# Patient Record
Sex: Female | Born: 1985 | Race: Black or African American | Hispanic: No | Marital: Single | Smoking: Former smoker
Health system: Southern US, Community
[De-identification: ages and names within clinical notes are randomized; demographics above are authoritative.]

## PROBLEM LIST (undated history)

## (undated) DIAGNOSIS — R7303 Prediabetes: Secondary | ICD-10-CM

## (undated) DIAGNOSIS — O24419 Gestational diabetes mellitus in pregnancy, unspecified control: Secondary | ICD-10-CM

## (undated) DIAGNOSIS — E119 Type 2 diabetes mellitus without complications: Secondary | ICD-10-CM

## (undated) DIAGNOSIS — G5603 Carpal tunnel syndrome, bilateral upper limbs: Secondary | ICD-10-CM

## (undated) DIAGNOSIS — J45909 Unspecified asthma, uncomplicated: Secondary | ICD-10-CM

## (undated) DIAGNOSIS — K5900 Constipation, unspecified: Secondary | ICD-10-CM

## (undated) HISTORY — PX: NO PAST SURGERIES: SHX2092

## (undated) HISTORY — DX: Type 2 diabetes mellitus without complications: E11.9

## (undated) HISTORY — DX: Gestational diabetes mellitus in pregnancy, unspecified control: O24.419

## (undated) HISTORY — DX: Unspecified asthma, uncomplicated: J45.909

---

## 2016-04-09 LAB — HEMOGLOBIN A1C: Hemoglobin A1C: 6.4

## 2016-04-09 LAB — OB RESULTS CONSOLE HGB/HCT, BLOOD
HEMATOCRIT: 40 %
HEMOGLOBIN: 13.3 g/dL

## 2016-04-09 LAB — OB RESULTS CONSOLE HEPATITIS B SURFACE ANTIGEN: Hepatitis B Surface Ag: NEGATIVE

## 2016-04-09 LAB — OB RESULTS CONSOLE ABO/RH: RH TYPE: POSITIVE

## 2016-04-09 LAB — OB RESULTS CONSOLE RPR: RPR: NONREACTIVE

## 2016-04-09 LAB — OB RESULTS CONSOLE PLATELET COUNT: Platelets: 273 10*3/uL

## 2016-04-09 LAB — OB RESULTS CONSOLE RUBELLA ANTIBODY, IGM: Rubella: IMMUNE

## 2016-04-09 LAB — OB RESULTS CONSOLE HIV ANTIBODY (ROUTINE TESTING): HIV: NONREACTIVE

## 2016-04-09 LAB — OB RESULTS CONSOLE ANTIBODY SCREEN: ANTIBODY SCREEN: NEGATIVE

## 2016-04-25 LAB — OB RESULTS CONSOLE GC/CHLAMYDIA
CHLAMYDIA, DNA PROBE: NEGATIVE
GC PROBE AMP, GENITAL: NEGATIVE

## 2016-04-25 LAB — CYTOLOGY - PAP
HPV DNA High Risk: NEGATIVE
Pap: NEGATIVE

## 2016-05-18 LAB — CREATININE CLEARANCE, URINE, 24 HOUR
CREATININE 24 H UR: 2.2
Protein, 24H Urine: 144

## 2016-06-27 LAB — DRUG SCREEN, URINE
Drug Screen, Urine: NEGATIVE
URINE CULTURE, OB: NEGATIVE

## 2016-09-24 NOTE — L&D Delivery Note (Signed)
Patient complete and pushing. SVD of viable female infant over intact perineum.  Infant delivered to mom's abdomen. Delayed cord clamping x 1 minute. Cord clamped x 2, cut. Spontaneous cry heard. Weight and Apgars pending. Cord blood obtained. Placenta delivered spontaneously and intact. LUS cleared of clot Vagina inspected.1st degree lacerations noted. Repaired with Vicryl Rapide EBL: 350cc Anesthesia: epidural, local

## 2016-10-01 LAB — OB RESULTS CONSOLE ABO/RH: RH Type: POSITIVE

## 2016-10-01 LAB — OB RESULTS CONSOLE RPR: RPR: NONREACTIVE

## 2016-10-01 LAB — OB RESULTS CONSOLE HIV ANTIBODY (ROUTINE TESTING): HIV: NONREACTIVE

## 2016-10-01 LAB — OB RESULTS CONSOLE ANTIBODY SCREEN: ANTIBODY SCREEN: NEGATIVE

## 2016-10-12 LAB — GLUCOSE, FASTING: Glucose Tolerance, Fasting: 80

## 2016-10-13 LAB — GLUCOSE, FASTING: GLUCOSE, FASTING-GESTATIONAL: 97

## 2016-10-17 LAB — OB RESULTS CONSOLE PLATELET COUNT: Platelets: 245 10*3/uL

## 2016-10-17 LAB — OB RESULTS CONSOLE HGB/HCT, BLOOD
HCT: 35 %
Hemoglobin: 11.5 g/dL

## 2016-10-22 ENCOUNTER — Encounter: Payer: Self-pay | Admitting: *Deleted

## 2016-10-25 ENCOUNTER — Encounter: Payer: Self-pay | Admitting: Obstetrics & Gynecology

## 2016-10-25 ENCOUNTER — Ambulatory Visit (INDEPENDENT_AMBULATORY_CARE_PROVIDER_SITE_OTHER): Payer: Medicaid Other | Admitting: Obstetrics & Gynecology

## 2016-10-25 VITALS — BP 124/65 | HR 100 | Ht 67.0 in | Wt 265.8 lb

## 2016-10-25 DIAGNOSIS — O24113 Pre-existing diabetes mellitus, type 2, in pregnancy, third trimester: Secondary | ICD-10-CM | POA: Diagnosis not present

## 2016-10-25 DIAGNOSIS — G5600 Carpal tunnel syndrome, unspecified upper limb: Secondary | ICD-10-CM

## 2016-10-25 DIAGNOSIS — IMO0001 Reserved for inherently not codable concepts without codable children: Secondary | ICD-10-CM

## 2016-10-25 DIAGNOSIS — Z658 Other specified problems related to psychosocial circumstances: Secondary | ICD-10-CM

## 2016-10-25 DIAGNOSIS — O24119 Pre-existing diabetes mellitus, type 2, in pregnancy, unspecified trimester: Secondary | ICD-10-CM

## 2016-10-25 DIAGNOSIS — E119 Type 2 diabetes mellitus without complications: Secondary | ICD-10-CM

## 2016-10-25 DIAGNOSIS — O26899 Other specified pregnancy related conditions, unspecified trimester: Secondary | ICD-10-CM

## 2016-10-25 DIAGNOSIS — Z87440 Personal history of urinary (tract) infections: Secondary | ICD-10-CM | POA: Insufficient documentation

## 2016-10-25 DIAGNOSIS — O0990 Supervision of high risk pregnancy, unspecified, unspecified trimester: Secondary | ICD-10-CM

## 2016-10-25 DIAGNOSIS — O0993 Supervision of high risk pregnancy, unspecified, third trimester: Secondary | ICD-10-CM

## 2016-10-25 DIAGNOSIS — O26893 Other specified pregnancy related conditions, third trimester: Secondary | ICD-10-CM

## 2016-10-25 DIAGNOSIS — J45909 Unspecified asthma, uncomplicated: Secondary | ICD-10-CM

## 2016-10-25 DIAGNOSIS — E669 Obesity, unspecified: Secondary | ICD-10-CM | POA: Insufficient documentation

## 2016-10-25 NOTE — Progress Notes (Signed)
Here for first prenatal visit. [facility] [place]. Given new pregnancy information.   [facility] results, pap smear results, and genetic testing results.

## 2016-10-25 NOTE — Progress Notes (Signed)
  Subjective:transfer care from [place], last visit 2-3 weeks ago    Andrea Diaz is a G1P0000 8535w3d being seen today for her first obstetrical visit.  Her obstetrical history is significant for class B DM. Patient does intend to breast feed. Pregnancy history fully reviewed.  Patient reports no complaints.  Vitals:   10/25/16 1249 10/25/16 1250  BP: 124/65   Pulse: 100   Weight: 265 lb 12.8 oz (120.6 kg)   Height:  5\' 7"  (1.702 m)    HISTORY: OB History  Gravida Para Term Preterm AB Living  1 0 0 0 0 0  SAB TAB Ectopic Multiple Live Births               # Outcome Date GA Lbr Len/2nd Weight Sex Delivery Anes PTL Lv  1 Current              Past Medical History:  Diagnosis Date  . Asthma   . Diabetes mellitus without complication (HCC)    History reviewed. No pertinent surgical history. Family History  Problem Relation Age of Onset  . Hypertension Mother   . Head & neck cancer Father   . Breast cancer Maternal Aunt   . Breast cancer Paternal Aunt      Exam    Uterus:     Pelvic Exam:                               System: Breast:      Skin: normal coloration and turgor, no rashes    Neurologic: oriented, normal mood   Extremities: normal strength, tone, and muscle mass   HEENT PERRLA   Mouth/Teeth     Neck supple   Cardiovascular: regular rate and rhythm   Respiratory:  appears well, vitals normal, no respiratory distress, acyanotic, normal RR   Abdomen: obese, gravid   Urinary: urethral meatus normal      Assessment:    Pregnancy: G1P0000 Patient Active Problem List   Diagnosis Date Noted  . Diabetes type 2, controlled (HCC) 10/25/2016  . Supervision of high-risk pregnancy 10/25/2016  . Asthma 10/25/2016  . Obesity 10/25/2016  . Psychosocial stressors 10/25/2016  . Carpal tunnel syndrome during pregnancy 10/25/2016  . Hx: UTI (urinary tract infection) 10/25/2016        Plan:     Initial labs drawn. Prenatal vitamins. Problem list  reviewed and updated. Genetic Screening discussed First Screen: reported as normal.  Ultrasound discussed; fetal survey: results reviewed.  Follow up in 1 weeks. 50% of 30 min visit spent on counseling and coordination of care.  FBS and PP reported as nl, bring record to next visit   Scheryl DarterJames Arnold 10/25/2016

## 2016-10-25 NOTE — Patient Instructions (Signed)
Third Trimester of Pregnancy The third trimester is from week 29 through week 40 (months 7 through 9). The third trimester is a time when the unborn baby (fetus) is growing rapidly. At the end of the ninth month, the fetus is about 20 inches in length and weighs 6-10 pounds. Body changes during your third trimester Your body goes through many changes during pregnancy. The changes vary from woman to woman. During the third trimester:  Your weight will continue to increase. You can expect to gain 25-35 pounds (11-16 kg) by the end of the pregnancy.  You may begin to get stretch marks on your hips, abdomen, and breasts.  You may urinate more often because the fetus is moving lower into your pelvis and pressing on your bladder.  You may develop or continue to have heartburn. This is caused by increased hormones that slow down muscles in the digestive tract.  You may develop or continue to have constipation because increased hormones slow digestion and cause the muscles that push waste through your intestines to relax.  You may develop hemorrhoids. These are swollen veins (varicose veins) in the rectum that can itch or be painful.  You may develop swollen, bulging veins (varicose veins) in your legs.  You may have increased body aches in the pelvis, back, or thighs. This is due to weight gain and increased hormones that are relaxing your joints.  You may have changes in your hair. These can include thickening of your hair, rapid growth, and changes in texture. Some women also have hair loss during or after pregnancy, or hair that feels dry or thin. Your hair will most likely return to normal after your baby is born.  Your breasts will continue to grow and they will continue to become tender. A yellow fluid (colostrum) may leak from your breasts. This is the first milk you are producing for your baby.  Your belly button may stick out.  You may notice more swelling in your hands, face, or  ankles.  You may have increased tingling or numbness in your hands, arms, and legs. The skin on your belly may also feel numb.  You may feel short of breath because of your expanding uterus.  You may have more problems sleeping. This can be caused by the size of your belly, increased need to urinate, and an increase in your body's metabolism.  You may notice the fetus "dropping," or moving lower in your abdomen.  You may have increased vaginal discharge.  Your cervix becomes thin and soft (effaced) near your due date. What to expect at prenatal visits You will have prenatal exams every 2 weeks until week 36. Then you will have weekly prenatal exams. During a routine prenatal visit:  You will be weighed to make sure you and the fetus are growing normally.  Your blood pressure will be taken.  Your abdomen will be measured to track your baby's growth.  The fetal heartbeat will be listened to.  Any test results from the previous visit will be discussed.  You may have a cervical check near your due date to see if you have effaced. At around 36 weeks, your health care provider will check your cervix. At the same time, your health care provider will also perform a test on the secretions of the vaginal tissue. This test is to determine if a type of bacteria, Group B streptococcus, is present. Your health care provider will explain this further. Your health care provider may ask you:    What your birth plan is.  How you are feeling.  If you are feeling the baby move.  If you have had any abnormal symptoms, such as leaking fluid, bleeding, severe headaches, or abdominal cramping.  If you are using any tobacco products, including cigarettes, chewing tobacco, and electronic cigarettes.  If you have any questions. Other tests or screenings that may be performed during your third trimester include:  Blood tests that check for low iron levels (anemia).  Fetal testing to check the health,  activity level, and growth of the fetus. Testing is done if you have certain medical conditions or if there are problems during the pregnancy.  Nonstress test (NST). This test checks the health of your baby to make sure there are no signs of problems, such as the baby not getting enough oxygen. During this test, a belt is placed around your belly. The baby is made to move, and its heart rate is monitored during movement. What is false labor? False labor is a condition in which you feel small, irregular tightenings of the muscles in the womb (contractions) that eventually go away. These are called Braxton Hicks contractions. Contractions may last for hours, days, or even weeks before true labor sets in. If contractions come at regular intervals, become more frequent, increase in intensity, or become painful, you should see your health care provider. What are the signs of labor?  Abdominal cramps.  Regular contractions that start at 10 minutes apart and become stronger and more frequent with time.  Contractions that start on the top of the uterus and spread down to the lower abdomen and back.  Increased pelvic pressure and dull back pain.  A watery or bloody mucus discharge that comes from the vagina.  Leaking of amniotic fluid. This is also known as your "water breaking." It could be a slow trickle or a gush. Let your doctor know if it has a color or strange odor. If you have any of these signs, call your health care provider right away, even if it is before your due date. Follow these instructions at home: Eating and drinking  Continue to eat regular, healthy meals.  Do not eat:  Raw meat or meat spreads.  Unpasteurized milk or cheese.  Unpasteurized juice.  Store-made salad.  Refrigerated smoked seafood.  Hot dogs or deli meat, unless they are piping hot.  More than 6 ounces of albacore tuna a week.  Shark, swordfish, king mackerel, or tile fish.  Store-made salads.  Raw  sprouts, such as mung bean or alfalfa sprouts.  Take prenatal vitamins as told by your health care provider.  Take 1000 mg of calcium daily as told by your health care provider.  If you develop constipation:  Take over-the-counter or prescription medicines.  Drink enough fluid to keep your urine clear or pale yellow.  Eat foods that are high in fiber, such as fresh fruits and vegetables, whole grains, and beans.  Limit foods that are high in fat and processed sugars, such as fried and sweet foods. Activity  Exercise only as directed by your health care provider. Healthy pregnant women should aim for 2 hours and 30 minutes of moderate exercise per week. If you experience any pain or discomfort while exercising, stop.  Avoid heavy lifting.  Do not exercise in extreme heat or humidity, or at high altitudes.  Wear low-heel, comfortable shoes.  Practice good posture.  Do not travel far distances unless it is absolutely necessary and only with the approval   of your health care provider.  Wear your seat belt at all times while in a car, on a bus, or on a plane.  Take frequent breaks and rest with your legs elevated if you have leg cramps or low back pain.  Do not use hot tubs, steam rooms, or saunas.  You may continue to have sex unless your health care provider tells you otherwise. Lifestyle  Do not use any products that contain nicotine or tobacco, such as cigarettes and e-cigarettes. If you need help quitting, ask your health care provider.  Do not drink alcohol.  Do not use any medicinal herbs or unprescribed drugs. These chemicals affect the formation and growth of the baby.  If you develop varicose veins:  Wear support pantyhose or compression stockings as told by your healthcare provider.  Elevate your feet for 15 minutes, 3-4 times a day.  Wear a supportive maternity bra to help with breast tenderness. General instructions  Take over-the-counter and prescription  medicines only as told by your health care provider. There are medicines that are either safe or unsafe to take during pregnancy.  Take warm sitz baths to soothe any pain or discomfort caused by hemorrhoids. Use hemorrhoid cream or witch hazel if your health care provider approves.  Avoid cat litter boxes and soil used by cats. These carry germs that can cause birth defects in the baby. If you have a cat, ask someone to clean the litter box for you.  To prepare for the arrival of your baby:  Take prenatal classes to understand, practice, and ask questions about the labor and delivery.  Make a trial run to the hospital.  Visit the hospital and tour the maternity area.  Arrange for maternity or paternity leave through employers.  Arrange for family and friends to take care of pets while you are in the hospital.  Purchase a rear-facing car seat and make sure you know how to install it in your car.  Pack your hospital bag.  Prepare the baby's nursery. Make sure to remove all pillows and stuffed animals from the baby's crib to prevent suffocation.  Visit your dentist if you have not gone during your pregnancy. Use a soft toothbrush to brush your teeth and be gentle when you floss.  Keep all prenatal follow-up visits as told by your health care provider. This is important. Contact a health care provider if:  You are unsure if you are in labor or if your water has broken.  You become dizzy.  You have mild pelvic cramps, pelvic pressure, or nagging pain in your abdominal area.  You have lower back pain.  You have persistent nausea, vomiting, or diarrhea.  You have an unusual or bad smelling vaginal discharge.  You have pain when you urinate. Get help right away if:  You have a fever.  You are leaking fluid from your vagina.  You have spotting or bleeding from your vagina.  You have severe abdominal pain or cramping.  You have rapid weight loss or weight gain.  You have  shortness of breath with chest pain.  You notice sudden or extreme swelling of your face, hands, ankles, feet, or legs.  Your baby makes fewer than 10 movements in 2 hours.  You have severe headaches that do not go away with medicine.  You have vision changes. Summary  The third trimester is from week 29 through week 40, months 7 through 9. The third trimester is a time when the unborn baby (fetus)   is growing rapidly.  During the third trimester, your discomfort may increase as you and your baby continue to gain weight. You may have abdominal, leg, and back pain, sleeping problems, and an increased need to urinate.  During the third trimester your breasts will keep growing and they will continue to become tender. A yellow fluid (colostrum) may leak from your breasts. This is the first milk you are producing for your baby.  False labor is a condition in which you feel small, irregular tightenings of the muscles in the womb (contractions) that eventually go away. These are called Braxton Hicks contractions. Contractions may last for hours, days, or even weeks before true labor sets in.  Signs of labor can include: abdominal cramps; regular contractions that start at 10 minutes apart and become stronger and more frequent with time; watery or bloody mucus discharge that comes from the vagina; increased pelvic pressure and dull back pain; and leaking of amniotic fluid. This information is not intended to replace advice given to you by your health care provider. Make sure you discuss any questions you have with your health care provider. Document Released: 09/04/2001 Document Revised: 02/16/2016 Document Reviewed: 11/11/2012 Elsevier Interactive Patient Education  2017 [employer]  

## 2016-10-26 LAB — POCT URINALYSIS DIP (DEVICE)
BILIRUBIN URINE: NEGATIVE
Glucose, UA: 100 mg/dL — AB
Ketones, ur: NEGATIVE mg/dL
LEUKOCYTES UA: NEGATIVE
NITRITE: NEGATIVE
PH: 6 (ref 5.0–8.0)
Protein, ur: 30 mg/dL — AB
Specific Gravity, Urine: >=1.03 (ref 1.005–1.030)
UROBILINOGEN UA: 0.2 mg/dL (ref 0.0–1.0)

## 2016-10-29 ENCOUNTER — Ambulatory Visit (INDEPENDENT_AMBULATORY_CARE_PROVIDER_SITE_OTHER): Payer: Medicaid Other | Admitting: Family Medicine

## 2016-10-29 VITALS — BP 122/69 | HR 110

## 2016-10-29 DIAGNOSIS — O24113 Pre-existing diabetes mellitus, type 2, in pregnancy, third trimester: Secondary | ICD-10-CM | POA: Diagnosis not present

## 2016-10-29 DIAGNOSIS — O24119 Pre-existing diabetes mellitus, type 2, in pregnancy, unspecified trimester: Secondary | ICD-10-CM

## 2016-10-29 NOTE — Progress Notes (Signed)
NST reviewed - reactive 

## 2016-11-01 ENCOUNTER — Ambulatory Visit (HOSPITAL_COMMUNITY)
Admission: RE | Admit: 2016-11-01 | Discharge: 2016-11-01 | Disposition: A | Discharge status: Home / Self Care | Payer: Medicaid Other | Source: Ambulatory Visit | Attending: Obstetrics & Gynecology | Admitting: Obstetrics & Gynecology

## 2016-11-01 ENCOUNTER — Other Ambulatory Visit: Payer: Self-pay | Admitting: Obstetrics & Gynecology

## 2016-11-01 ENCOUNTER — Encounter (HOSPITAL_COMMUNITY): Payer: Self-pay

## 2016-11-01 ENCOUNTER — Ambulatory Visit (INDEPENDENT_AMBULATORY_CARE_PROVIDER_SITE_OTHER): Payer: Medicaid Other | Admitting: Obstetrics and Gynecology

## 2016-11-01 DIAGNOSIS — O24113 Pre-existing diabetes mellitus, type 2, in pregnancy, third trimester: Secondary | ICD-10-CM | POA: Diagnosis not present

## 2016-11-01 DIAGNOSIS — O0993 Supervision of high risk pregnancy, unspecified, third trimester: Secondary | ICD-10-CM

## 2016-11-01 DIAGNOSIS — O99213 Obesity complicating pregnancy, third trimester: Secondary | ICD-10-CM

## 2016-11-01 DIAGNOSIS — IMO0001 Reserved for inherently not codable concepts without codable children: Secondary | ICD-10-CM

## 2016-11-01 DIAGNOSIS — Z3689 Encounter for other specified antenatal screening: Secondary | ICD-10-CM | POA: Diagnosis not present

## 2016-11-01 DIAGNOSIS — J45909 Unspecified asthma, uncomplicated: Secondary | ICD-10-CM

## 2016-11-01 DIAGNOSIS — O24119 Pre-existing diabetes mellitus, type 2, in pregnancy, unspecified trimester: Secondary | ICD-10-CM

## 2016-11-01 DIAGNOSIS — Z658 Other specified problems related to psychosocial circumstances: Secondary | ICD-10-CM | POA: Diagnosis not present

## 2016-11-01 DIAGNOSIS — Z3A35 35 weeks gestation of pregnancy: Secondary | ICD-10-CM

## 2016-11-01 DIAGNOSIS — O26899 Other specified pregnancy related conditions, unspecified trimester: Secondary | ICD-10-CM

## 2016-11-01 DIAGNOSIS — E119 Type 2 diabetes mellitus without complications: Secondary | ICD-10-CM

## 2016-11-01 DIAGNOSIS — G5600 Carpal tunnel syndrome, unspecified upper limb: Secondary | ICD-10-CM

## 2016-11-01 DIAGNOSIS — Z87440 Personal history of urinary (tract) infections: Secondary | ICD-10-CM

## 2016-11-01 DIAGNOSIS — O26893 Other specified pregnancy related conditions, third trimester: Secondary | ICD-10-CM | POA: Diagnosis not present

## 2016-11-01 NOTE — Progress Notes (Signed)
US scheduled for today.

## 2016-11-01 NOTE — Progress Notes (Signed)
   PRENATAL VISIT NOTE  Subjective:  Andrea Diaz is a [age] [address].o. G1P0000 at 8387w3d being seen today for ongoing prenatal care.  She is currently monitored for the following issues for this high-risk pregnancy and has Diabetes type 2, controlled (HCC); Supervision of high-risk pregnancy; Asthma; Obesity; Psychosocial stressors; Carpal tunnel syndrome during pregnancy; and Hx: UTI (urinary tract infection) on her problem list.  Patient reports no complaints.   .  .   . Denies leaking of fluid.   The following portions of the patient's history were reviewed and updated as appropriate: allergies, current medications, past family history, past medical history, past social history, past surgical history and problem list. Problem list updated.  Objective:  There were no vitals filed for this visit.  Fetal Status:           General:  Alert, oriented and cooperative. Patient is in no acute distress.  Skin: Skin is warm and dry. No rash noted.   Cardiovascular: Normal heart rate noted  Respiratory: Normal respiratory effort, no problems with respiration noted  Abdomen: Soft, gravid, appropriate for gestational age.       Pelvic:  Cervical exam deferred        Extremities: Normal range of motion.     Mental Status: Normal mood and affect. Normal behavior. Normal judgment and thought content.   Assessment and Plan:  Pregnancy: G1P0000 at 6887w3d  1. Pre-existing type 2 diabetes mellitus during pregnancy, antepartum CBGs reviewed and majority within range. Fasting are in the low 100.  Discussed consuming a protein rich snack which the patient is not doing Encouraged the patient to increase her physical activity by walking 30 minutes daily Continue current Lantus and humolog regimen NST reviewed and reactive  2. Supervision of high risk pregnancy in third trimester Patient is doing well Follow up growth ultrasound today Cultures next visit  Preterm labor symptoms and general obstetric precautions  including but not limited to vaginal bleeding, contractions, leaking of fluid and fetal movement were reviewed in detail with the patient. Please refer to After Visit Summary for other counseling recommendations.  Return in about 7 days (around 11/08/2016) for Ob fu and NST/AFI.   Catalina AntiguaPeggy Carles Florea, MD

## 2016-11-02 LAB — POCT URINALYSIS DIP (DEVICE)
BILIRUBIN URINE: NEGATIVE
Glucose, UA: 500 mg/dL — AB
KETONES UR: NEGATIVE mg/dL
Nitrite: NEGATIVE
PH: 6.5 (ref 5.0–8.0)
Protein, ur: NEGATIVE mg/dL
Specific Gravity, Urine: 1.02 (ref 1.005–1.030)
Urobilinogen, UA: 0.2 mg/dL (ref 0.0–1.0)

## 2016-11-05 ENCOUNTER — Ambulatory Visit (INDEPENDENT_AMBULATORY_CARE_PROVIDER_SITE_OTHER): Payer: Medicaid Other | Admitting: *Deleted

## 2016-11-05 DIAGNOSIS — O24119 Pre-existing diabetes mellitus, type 2, in pregnancy, unspecified trimester: Secondary | ICD-10-CM

## 2016-11-05 DIAGNOSIS — O24113 Pre-existing diabetes mellitus, type 2, in pregnancy, third trimester: Secondary | ICD-10-CM | POA: Diagnosis present

## 2016-11-05 MED ORDER — FREESTYLE LITE TEST VI STRP: ORAL_STRIP | 12 refills | Status: AC

## 2016-11-05 NOTE — Progress Notes (Signed)
Pt requested Rx for test strips - sent to pharmacy as requested.

## 2016-11-06 ENCOUNTER — Telehealth: Payer: Self-pay | Admitting: *Deleted

## 2016-11-06 MED ORDER — ACCU-CHEK SOFTCLIX LANCETS MISC: 12 refills | Status: AC

## 2016-11-06 MED ORDER — GLUCOSE BLOOD VI STRP: ORAL_STRIP | 12 refills | Status: AC

## 2016-11-06 MED ORDER — BLOOD GLUCOSE METER KIT: PACK | 0 refills | Status: DC

## 2016-11-06 MED ORDER — BLOOD GLUCOSE METER KIT: PACK | 0 refills | Status: AC

## 2016-11-06 NOTE — Telephone Encounter (Signed)
Pt left message stating that the prescription for Freestyle Lite test strips was [facility]. She requests a new prescription. After speaking with the pharmacist @ pt's pharmacy, I placed new orders for glucose meter and testing supplies which will be covered by [place] Medicaid.  I called pt and left message stating this information and she may obtain supplies today from [facility]. She may call back if she has additional questions.

## 2016-11-09 ENCOUNTER — Ambulatory Visit (INDEPENDENT_AMBULATORY_CARE_PROVIDER_SITE_OTHER): Payer: Medicaid Other | Admitting: Obstetrics & Gynecology

## 2016-11-09 ENCOUNTER — Ambulatory Visit: Payer: Self-pay

## 2016-11-09 ENCOUNTER — Other Ambulatory Visit (HOSPITAL_COMMUNITY)
Admission: RE | Admit: 2016-11-09 | Discharge: 2016-11-09 | Disposition: A | Discharge status: Home / Self Care | Payer: Medicaid Other | Source: Ambulatory Visit | Attending: Obstetrics & Gynecology | Admitting: Obstetrics & Gynecology

## 2016-11-09 VITALS — BP 127/69 | HR 112

## 2016-11-09 DIAGNOSIS — G5600 Carpal tunnel syndrome, unspecified upper limb: Secondary | ICD-10-CM | POA: Diagnosis not present

## 2016-11-09 DIAGNOSIS — O24113 Pre-existing diabetes mellitus, type 2, in pregnancy, third trimester: Secondary | ICD-10-CM | POA: Diagnosis not present

## 2016-11-09 DIAGNOSIS — Z3689 Encounter for other specified antenatal screening: Secondary | ICD-10-CM

## 2016-11-09 DIAGNOSIS — O24119 Pre-existing diabetes mellitus, type 2, in pregnancy, unspecified trimester: Secondary | ICD-10-CM

## 2016-11-09 DIAGNOSIS — Z113 Encounter for screening for infections with a predominantly sexual mode of transmission: Secondary | ICD-10-CM | POA: Diagnosis present

## 2016-11-09 DIAGNOSIS — O26893 Other specified pregnancy related conditions, third trimester: Secondary | ICD-10-CM | POA: Diagnosis not present

## 2016-11-09 DIAGNOSIS — O0993 Supervision of high risk pregnancy, unspecified, third trimester: Secondary | ICD-10-CM

## 2016-11-09 DIAGNOSIS — Z658 Other specified problems related to psychosocial circumstances: Secondary | ICD-10-CM | POA: Diagnosis not present

## 2016-11-09 LAB — OB RESULTS CONSOLE GBS: STREP GROUP B AG: POSITIVE

## 2016-11-09 MED ORDER — POLYETHYLENE GLYCOL 3350 17 G PO PACK: 17.0000 g | PACK | Freq: Every day | ORAL | 0 refills | Status: AC

## 2016-11-09 NOTE — Patient Instructions (Signed)
Third Trimester of Pregnancy The third trimester is from week 29 through week 40 (months 7 through 9). The third trimester is a time when the unborn baby (fetus) is growing rapidly. At the end of the ninth month, the fetus is about 20 inches in length and weighs 6-10 pounds. Body changes during your third trimester Your body goes through many changes during pregnancy. The changes vary from woman to woman. During the third trimester:  Your weight will continue to increase. You can expect to gain 25-35 pounds (11-16 kg) by the end of the pregnancy.  You may begin to get stretch marks on your hips, abdomen, and breasts.  You may urinate more often because the fetus is moving lower into your pelvis and pressing on your bladder.  You may develop or continue to have heartburn. This is caused by increased hormones that slow down muscles in the digestive tract.  You may develop or continue to have constipation because increased hormones slow digestion and cause the muscles that push waste through your intestines to relax.  You may develop hemorrhoids. These are swollen veins (varicose veins) in the rectum that can itch or be painful.  You may develop swollen, bulging veins (varicose veins) in your legs.  You may have increased body aches in the pelvis, back, or thighs. This is due to weight gain and increased hormones that are relaxing your joints.  You may have changes in your hair. These can include thickening of your hair, rapid growth, and changes in texture. Some women also have hair loss during or after pregnancy, or hair that feels dry or thin. Your hair will most likely return to normal after your baby is born.  Your breasts will continue to grow and they will continue to become tender. A yellow fluid (colostrum) may leak from your breasts. This is the first milk you are producing for your baby.  Your belly button may stick out.  You may notice more swelling in your hands, face, or  ankles.  You may have increased tingling or numbness in your hands, arms, and legs. The skin on your belly may also feel numb.  You may feel short of breath because of your expanding uterus.  You may have more problems sleeping. This can be caused by the size of your belly, increased need to urinate, and an increase in your body's metabolism.  You may notice the fetus "dropping," or moving lower in your abdomen.  You may have increased vaginal discharge.  Your cervix becomes thin and soft (effaced) near your due date. What to expect at prenatal visits You will have prenatal exams every 2 weeks until week 36. Then you will have weekly prenatal exams. During a routine prenatal visit:  You will be weighed to make sure you and the fetus are growing normally.  Your blood pressure will be taken.  Your abdomen will be measured to track your baby's growth.  The fetal heartbeat will be listened to.  Any test results from the previous visit will be discussed.  You may have a cervical check near your due date to see if you have effaced. At around 36 weeks, your health care provider will check your cervix. At the same time, your health care provider will also perform a test on the secretions of the vaginal tissue. This test is to determine if a type of bacteria, Group B streptococcus, is present. Your health care provider will explain this further. Your health care provider may ask you:    What your birth plan is.  How you are feeling.  If you are feeling the baby move.  If you have had any abnormal symptoms, such as leaking fluid, bleeding, severe headaches, or abdominal cramping.  If you are using any tobacco products, including cigarettes, chewing tobacco, and electronic cigarettes.  If you have any questions. Other tests or screenings that may be performed during your third trimester include:  Blood tests that check for low iron levels (anemia).  Fetal testing to check the health,  activity level, and growth of the fetus. Testing is done if you have certain medical conditions or if there are problems during the pregnancy.  Nonstress test (NST). This test checks the health of your baby to make sure there are no signs of problems, such as the baby not getting enough oxygen. During this test, a belt is placed around your belly. The baby is made to move, and its heart rate is monitored during movement. What is false labor? False labor is a condition in which you feel small, irregular tightenings of the muscles in the womb (contractions) that eventually go away. These are called Braxton Hicks contractions. Contractions may last for hours, days, or even weeks before true labor sets in. If contractions come at regular intervals, become more frequent, increase in intensity, or become painful, you should see your health care provider. What are the signs of labor?  Abdominal cramps.  Regular contractions that start at 10 minutes apart and become stronger and more frequent with time.  Contractions that start on the top of the uterus and spread down to the lower abdomen and back.  Increased pelvic pressure and dull back pain.  A watery or bloody mucus discharge that comes from the vagina.  Leaking of amniotic fluid. This is also known as your "water breaking." It could be a slow trickle or a gush. Let your doctor know if it has a color or strange odor. If you have any of these signs, call your health care provider right away, even if it is before your due date. Follow these instructions at home: Eating and drinking  Continue to eat regular, healthy meals.  Do not eat:  Raw meat or meat spreads.  Unpasteurized milk or cheese.  Unpasteurized juice.  Store-made salad.  Refrigerated smoked seafood.  Hot dogs or deli meat, unless they are piping hot.  More than 6 ounces of albacore tuna a week.  Shark, swordfish, king mackerel, or tile fish.  Store-made salads.  Raw  sprouts, such as mung bean or alfalfa sprouts.  Take prenatal vitamins as told by your health care provider.  Take 1000 mg of calcium daily as told by your health care provider.  If you develop constipation:  Take over-the-counter or prescription medicines.  Drink enough fluid to keep your urine clear or pale yellow.  Eat foods that are high in fiber, such as fresh fruits and vegetables, whole grains, and beans.  Limit foods that are high in fat and processed sugars, such as fried and sweet foods. Activity  Exercise only as directed by your health care provider. Healthy pregnant women should aim for 2 hours and 30 minutes of moderate exercise per week. If you experience any pain or discomfort while exercising, stop.  Avoid heavy lifting.  Do not exercise in extreme heat or humidity, or at high altitudes.  Wear low-heel, comfortable shoes.  Practice good posture.  Do not travel far distances unless it is absolutely necessary and only with the approval   of your health care provider.  Wear your seat belt at all times while in a car, on a bus, or on a plane.  Take frequent breaks and rest with your legs elevated if you have leg cramps or low back pain.  Do not use hot tubs, steam rooms, or saunas.  You may continue to have sex unless your health care provider tells you otherwise. Lifestyle  Do not use any products that contain nicotine or tobacco, such as cigarettes and e-cigarettes. If you need help quitting, ask your health care provider.  Do not drink alcohol.  Do not use any medicinal herbs or unprescribed drugs. These chemicals affect the formation and growth of the baby.  If you develop varicose veins:  Wear support pantyhose or compression stockings as told by your healthcare provider.  Elevate your feet for 15 minutes, 3-4 times a day.  Wear a supportive maternity bra to help with breast tenderness. General instructions  Take over-the-counter and prescription  medicines only as told by your health care provider. There are medicines that are either safe or unsafe to take during pregnancy.  Take warm sitz baths to soothe any pain or discomfort caused by hemorrhoids. Use hemorrhoid cream or witch hazel if your health care provider approves.  Avoid cat litter boxes and soil used by cats. These carry germs that can cause birth defects in the baby. If you have a cat, ask someone to clean the litter box for you.  To prepare for the arrival of your baby:  Take prenatal classes to understand, practice, and ask questions about the labor and delivery.  Make a trial run to the hospital.  Visit the hospital and tour the maternity area.  Arrange for maternity or paternity leave through employers.  Arrange for family and friends to take care of pets while you are in the hospital.  Purchase a rear-facing car seat and make sure you know how to install it in your car.  Pack your hospital bag.  Prepare the baby's nursery. Make sure to remove all pillows and stuffed animals from the baby's crib to prevent suffocation.  Visit your dentist if you have not gone during your pregnancy. Use a soft toothbrush to brush your teeth and be gentle when you floss.  Keep all prenatal follow-up visits as told by your health care provider. This is important. Contact a health care provider if:  You are unsure if you are in labor or if your water has broken.  You become dizzy.  You have mild pelvic cramps, pelvic pressure, or nagging pain in your abdominal area.  You have lower back pain.  You have persistent nausea, vomiting, or diarrhea.  You have an unusual or bad smelling vaginal discharge.  You have pain when you urinate. Get help right away if:  You have a fever.  You are leaking fluid from your vagina.  You have spotting or bleeding from your vagina.  You have severe abdominal pain or cramping.  You have rapid weight loss or weight gain.  You have  shortness of breath with chest pain.  You notice sudden or extreme swelling of your face, hands, ankles, feet, or legs.  Your baby makes fewer than 10 movements in 2 hours.  You have severe headaches that do not go away with medicine.  You have vision changes. Summary  The third trimester is from week 29 through week 40, months 7 through 9. The third trimester is a time when the unborn baby (fetus)   is growing rapidly.  During the third trimester, your discomfort may increase as you and your baby continue to gain weight. You may have abdominal, leg, and back pain, sleeping problems, and an increased need to urinate.  During the third trimester your breasts will keep growing and they will continue to become tender. A yellow fluid (colostrum) may leak from your breasts. This is the first milk you are producing for your baby.  False labor is a condition in which you feel small, irregular tightenings of the muscles in the womb (contractions) that eventually go away. These are called Braxton Hicks contractions. Contractions may last for hours, days, or even weeks before true labor sets in.  Signs of labor can include: abdominal cramps; regular contractions that start at 10 minutes apart and become stronger and more frequent with time; watery or bloody mucus discharge that comes from the vagina; increased pelvic pressure and dull back pain; and leaking of amniotic fluid. This information is not intended to replace advice given to you by your health care provider. Make sure you discuss any questions you have with your health care provider. Document Released: 09/04/2001 Document Revised: 02/16/2016 Document Reviewed: 11/11/2012 Elsevier Interactive Patient Education  2017 [employer]  

## 2016-11-09 NOTE — Progress Notes (Signed)
US done 2/8.  Pt reports severe constipation.

## 2016-11-09 NOTE — Progress Notes (Signed)
   PRENATAL VISIT NOTE  Subjective:  Andrea Diaz is a 31 y.o. G1P0000 at 1724w4d being seen today for ongoing prenatal care.  She is currently monitored for the following issues for this high-risk pregnancy and has Diabetes type 2, controlled (HCC); Supervision of high-risk pregnancy; Asthma; Obesity; Psychosocial stressors; Carpal tunnel syndrome during pregnancy; and Hx: UTI (urinary tract infection) on her problem list.  Patient reports constipation.  Contractions: Irregular. Vag. Bleeding: None.  Movement: Present. Denies leaking of fluid.   The following portions of the patient's history were reviewed and updated as appropriate: allergies, current medications, past family history, past medical history, past social history, past surgical history and problem list. Problem list updated.  Objective:   Vitals:   11/09/16 0959  BP: 127/69  Pulse: (!) 112    Fetal Status: Fetal Heart Rate (bpm): NST   Movement: Present     General:  Alert, oriented and cooperative. Patient is in no acute distress.  Skin: Skin is warm and dry. No rash noted.   Cardiovascular: Normal heart rate noted  Respiratory: Normal respiratory effort, no problems with respiration noted  Abdomen: Soft, gravid, appropriate for gestational age. Pain/Pressure: Present     Pelvic:  Cervical exam deferred        Extremities: Normal range of motion.     Mental Status: Normal mood and affect. Normal behavior. Normal judgment and thought content.   Assessment and Plan:  Pregnancy: G1P0000 at 4724w4d  1. Pre-existing type 2 diabetes mellitus during pregnancy, antepartum States BG values are stable and in range, will not change insulin dose - Fetal nonstress test - US OB Limited  2. Supervision of high risk pregnancy in third trimester  - Culture, beta strep (group b only) - GC/Chlamydia probe amp ([place] - polyethylene glycol (MIRALAX) packet; Take 17 g by mouth daily.  Dispense: 14 each; Refill:  0  Preterm labor symptoms and general obstetric precautions including but not limited to vaginal bleeding, contractions, leaking of fluid and fetal movement were reviewed in detail with the patient. Please refer to After Visit Summary for other counseling recommendations.  Return in about 6 days (around 11/15/2016) for 2/22 or 2/23  for Ob fu and NST/AFI.   Adam PhenixJames G Arnold, MD

## 2016-11-10 LAB — CULTURE, BETA STREP (GROUP B ONLY)

## 2016-11-12 ENCOUNTER — Ambulatory Visit (INDEPENDENT_AMBULATORY_CARE_PROVIDER_SITE_OTHER): Payer: Medicaid Other | Admitting: Obstetrics & Gynecology

## 2016-11-12 VITALS — BP 132/82 | HR 103

## 2016-11-12 DIAGNOSIS — O24113 Pre-existing diabetes mellitus, type 2, in pregnancy, third trimester: Secondary | ICD-10-CM

## 2016-11-12 DIAGNOSIS — O24119 Pre-existing diabetes mellitus, type 2, in pregnancy, unspecified trimester: Secondary | ICD-10-CM

## 2016-11-12 LAB — GC/CHLAMYDIA PROBE AMP (~~LOC~~) NOT AT ARMC
Chlamydia: NEGATIVE
NEISSERIA GONORRHEA: NEGATIVE

## 2016-11-12 NOTE — Progress Notes (Signed)
NST Reactive on 11/12/16

## 2016-11-14 NOTE — Progress Notes (Signed)
Reactive NST on 11/05/16

## 2016-11-15 ENCOUNTER — Telehealth (HOSPITAL_COMMUNITY): Payer: Self-pay | Admitting: *Deleted

## 2016-11-15 ENCOUNTER — Encounter (HOSPITAL_COMMUNITY): Payer: Self-pay | Admitting: *Deleted

## 2016-11-15 ENCOUNTER — Ambulatory Visit: Payer: Self-pay

## 2016-11-15 ENCOUNTER — Ambulatory Visit (INDEPENDENT_AMBULATORY_CARE_PROVIDER_SITE_OTHER): Payer: Medicaid Other | Admitting: Advanced Practice Midwife

## 2016-11-15 VITALS — BP 135/86 | HR 105 | Wt 271.1 lb

## 2016-11-15 DIAGNOSIS — O24113 Pre-existing diabetes mellitus, type 2, in pregnancy, third trimester: Secondary | ICD-10-CM

## 2016-11-15 DIAGNOSIS — Z3689 Encounter for other specified antenatal screening: Secondary | ICD-10-CM | POA: Diagnosis not present

## 2016-11-15 DIAGNOSIS — O24119 Pre-existing diabetes mellitus, type 2, in pregnancy, unspecified trimester: Secondary | ICD-10-CM

## 2016-11-15 DIAGNOSIS — O0993 Supervision of high risk pregnancy, unspecified, third trimester: Secondary | ICD-10-CM

## 2016-11-15 LAB — POCT URINALYSIS DIP (DEVICE)
BILIRUBIN URINE: NEGATIVE
GLUCOSE, UA: 500 mg/dL — AB
Ketones, ur: NEGATIVE mg/dL
NITRITE: NEGATIVE
Protein, ur: NEGATIVE mg/dL
SPECIFIC GRAVITY, URINE: 1.025 (ref 1.005–1.030)
UROBILINOGEN UA: 1 mg/dL (ref 0.0–1.0)
pH: 7 (ref 5.0–8.0)

## 2016-11-15 NOTE — Telephone Encounter (Signed)
Preadmission screen  

## 2016-11-15 NOTE — Progress Notes (Signed)
   PRENATAL VISIT NOTE  Subjective:  Andrea Diaz is a 31 y.o. G1P0000 at 7520w3d being seen today for ongoing prenatal care.  She is currently monitored for the following issues for this high-risk pregnancy and has Diabetes type 2, controlled (HCC); Supervision of high-risk pregnancy; Asthma; Obesity; Psychosocial stressors; Carpal tunnel syndrome during pregnancy; and Hx: UTI (urinary tract infection) on her problem list.  Patient reports no complaints.  Contractions: Irregular. Vag. Bleeding: None.  Movement: Present. Denies leaking of fluid.   The following portions of the patient's history were reviewed and updated as appropriate: allergies, current medications, past family history, past medical history, past social history, past surgical history and problem list. Problem list updated.  Objective:   Vitals:   11/15/16 1346  BP: 135/86  Pulse: (!) 105  Weight: 271 lb 1.6 oz (123 kg)    Fetal Status: Fetal Heart Rate (bpm): NST   Movement: Present     General:  Alert, oriented and cooperative. Patient is in no acute distress.  Skin: Skin is warm and dry. No rash noted.   Cardiovascular: Normal heart rate noted  Respiratory: Normal respiratory effort, no problems with respiration noted  Abdomen: Soft, gravid, appropriate for gestational age. Pain/Pressure: Present     Pelvic:  Cervical exam deferred        Extremities: Normal range of motion.     Mental Status: Normal mood and affect. Normal behavior. Normal judgment and thought content.   Assessment and Plan:  Pregnancy: G1P0000 at 9620w3d  1. Pre-existing type 2 diabetes mellitus during pregnancy, antepartum --Reviewed glucose log.  Fasting: 74-94, with 2 out of 10 in 90s.  PP 94-133, 2 out of 15 in 130s, with 1 outlier 188 after [employer] lunch.  No changes to insulin regimen today, encouraged following diet closely and 30 minutes of exercise daily. - Fetal nonstress test - US OB Limited  2. Supervision of high risk  pregnancy in third trimester   Term labor symptoms and general obstetric precautions including but not limited to vaginal bleeding, contractions, leaking of fluid and fetal movement were reviewed in detail with the patient. Please refer to After Visit Summary for other counseling recommendations.  Return in about 7 days (around 11/22/2016) for Ob fu and NST after US - has US @ 1330.   Hurshel PartyLisa A Leftwich-Kirby, CNM

## 2016-11-15 NOTE — Progress Notes (Signed)
Pt informed that the ultrasound is considered a limited OB ultrasound and is not intended to be a complete ultrasound exam.  Patient also informed that the ultrasound is not being completed with the intent of assessing for fetal or placental anomalies or any pelvic abnormalities.  Explained that the purpose of today's ultrasound is to assess for presentation and amniotic fluid volume.  Patient acknowledges the purpose of the exam and the limitations of the study.    

## 2016-11-19 ENCOUNTER — Other Ambulatory Visit: Payer: Medicaid Other

## 2016-11-19 ENCOUNTER — Other Ambulatory Visit: Payer: Medicaid Other | Admitting: Obstetrics and Gynecology

## 2016-11-20 ENCOUNTER — Ambulatory Visit (INDEPENDENT_AMBULATORY_CARE_PROVIDER_SITE_OTHER): Payer: Medicaid Other | Admitting: *Deleted

## 2016-11-20 VITALS — BP 133/73 | HR 109

## 2016-11-20 DIAGNOSIS — O24113 Pre-existing diabetes mellitus, type 2, in pregnancy, third trimester: Secondary | ICD-10-CM

## 2016-11-20 DIAGNOSIS — O24119 Pre-existing diabetes mellitus, type 2, in pregnancy, unspecified trimester: Secondary | ICD-10-CM

## 2016-11-20 NOTE — Progress Notes (Signed)
NST reactive.

## 2016-11-21 ENCOUNTER — Inpatient Hospital Stay (HOSPITAL_COMMUNITY): Payer: Medicaid Other | Admitting: Anesthesiology

## 2016-11-21 ENCOUNTER — Inpatient Hospital Stay (HOSPITAL_COMMUNITY)
Admission: AD | Admit: 2016-11-21 | Discharge: 2016-11-23 | DRG: 774 | Disposition: A | Discharge status: Home / Self Care | Payer: Medicaid Other | Source: Ambulatory Visit | Attending: Family Medicine | Admitting: Family Medicine

## 2016-11-21 ENCOUNTER — Encounter (HOSPITAL_COMMUNITY): Payer: Self-pay | Admitting: *Deleted

## 2016-11-21 DIAGNOSIS — Z3493 Encounter for supervision of normal pregnancy, unspecified, third trimester: Secondary | ICD-10-CM | POA: Diagnosis present

## 2016-11-21 DIAGNOSIS — O99214 Obesity complicating childbirth: Secondary | ICD-10-CM | POA: Diagnosis present

## 2016-11-21 DIAGNOSIS — E119 Type 2 diabetes mellitus without complications: Secondary | ICD-10-CM | POA: Diagnosis present

## 2016-11-21 DIAGNOSIS — Z3A38 38 weeks gestation of pregnancy: Secondary | ICD-10-CM

## 2016-11-21 DIAGNOSIS — O9952 Diseases of the respiratory system complicating childbirth: Secondary | ICD-10-CM | POA: Diagnosis present

## 2016-11-21 DIAGNOSIS — Z87891 Personal history of nicotine dependence: Secondary | ICD-10-CM | POA: Diagnosis not present

## 2016-11-21 DIAGNOSIS — Z6841 Body Mass Index (BMI) 40.0 and over, adult: Secondary | ICD-10-CM | POA: Diagnosis not present

## 2016-11-21 DIAGNOSIS — O26899 Other specified pregnancy related conditions, unspecified trimester: Secondary | ICD-10-CM

## 2016-11-21 DIAGNOSIS — Z87440 Personal history of urinary (tract) infections: Secondary | ICD-10-CM

## 2016-11-21 DIAGNOSIS — Z794 Long term (current) use of insulin: Secondary | ICD-10-CM | POA: Diagnosis not present

## 2016-11-21 DIAGNOSIS — J45909 Unspecified asthma, uncomplicated: Secondary | ICD-10-CM | POA: Diagnosis present

## 2016-11-21 DIAGNOSIS — Z658 Other specified problems related to psychosocial circumstances: Secondary | ICD-10-CM

## 2016-11-21 DIAGNOSIS — O99824 Streptococcus B carrier state complicating childbirth: Secondary | ICD-10-CM | POA: Diagnosis present

## 2016-11-21 DIAGNOSIS — O2412 Pre-existing diabetes mellitus, type 2, in childbirth: Secondary | ICD-10-CM | POA: Diagnosis present

## 2016-11-21 DIAGNOSIS — IMO0001 Reserved for inherently not codable concepts without codable children: Secondary | ICD-10-CM

## 2016-11-21 DIAGNOSIS — G5600 Carpal tunnel syndrome, unspecified upper limb: Secondary | ICD-10-CM | POA: Diagnosis present

## 2016-11-21 DIAGNOSIS — O0993 Supervision of high risk pregnancy, unspecified, third trimester: Secondary | ICD-10-CM

## 2016-11-21 HISTORY — DX: Constipation, unspecified: K59.00

## 2016-11-21 LAB — CBC
HEMATOCRIT: 34.7 % — AB (ref 36.0–46.0)
HEMOGLOBIN: 11.7 g/dL — AB (ref 12.0–15.0)
MCH: 26.3 pg (ref 26.0–34.0)
MCHC: 33.7 g/dL (ref 30.0–36.0)
MCV: 78 fL (ref 78.0–100.0)
Platelets: 263 10*3/uL (ref 150–400)
RBC: 4.45 MIL/uL (ref 3.87–5.11)
RDW: 14.5 % (ref 11.5–15.5)
WBC: 14.1 10*3/uL — ABNORMAL HIGH (ref 4.0–10.5)

## 2016-11-21 LAB — TYPE AND SCREEN
ABO/RH(D): A POS
Antibody Screen: NEGATIVE

## 2016-11-21 LAB — GLUCOSE, CAPILLARY
GLUCOSE-CAPILLARY: 130 mg/dL — AB (ref 65–99)
GLUCOSE-CAPILLARY: 163 mg/dL — AB (ref 65–99)
GLUCOSE-CAPILLARY: 98 mg/dL (ref 65–99)
Glucose-Capillary: 125 mg/dL — ABNORMAL HIGH (ref 65–99)

## 2016-11-21 LAB — ABO/RH: ABO/RH(D): A POS

## 2016-11-21 MED ORDER — LACTATED RINGERS IV SOLN: 500.0000 mL | INTRAVENOUS | Status: DC | PRN

## 2016-11-21 MED ORDER — EPHEDRINE 5 MG/ML INJ
10.0000 mg | INTRAVENOUS | Status: DC | PRN
  Filled 2016-11-21: qty 4

## 2016-11-21 MED ORDER — OXYCODONE-ACETAMINOPHEN 5-325 MG PO TABS: 2.0000 | ORAL_TABLET | ORAL | Status: DC | PRN

## 2016-11-21 MED ORDER — PHENYLEPHRINE 40 MCG/ML (10ML) SYRINGE FOR IV PUSH (FOR BLOOD PRESSURE SUPPORT)
80.0000 ug | PREFILLED_SYRINGE | INTRAVENOUS | Status: DC | PRN
  Filled 2016-11-21: qty 5

## 2016-11-21 MED ORDER — FENTANYL 2.5 MCG/ML BUPIVACAINE 1/10 % EPIDURAL INFUSION (WH - ANES)
14.0000 mL/h | INTRAMUSCULAR | Status: DC | PRN
  Administered 2016-11-21: 14 mL/h via EPIDURAL
  Filled 2016-11-21: qty 100

## 2016-11-21 MED ORDER — DIPHENHYDRAMINE HCL 50 MG/ML IJ SOLN: 12.5000 mg | INTRAMUSCULAR | Status: DC | PRN

## 2016-11-21 MED ORDER — LIDOCAINE HCL (PF) 1 % IJ SOLN
INTRAMUSCULAR | Status: DC | PRN
  Administered 2016-11-21: 2 mL
  Administered 2016-11-21: 3 mL
  Administered 2016-11-21: 5 mL

## 2016-11-21 MED ORDER — OXYTOCIN BOLUS FROM INFUSION
500.0000 mL | Freq: Once | INTRAVENOUS | Status: AC
Start: 2016-11-21 — End: 2016-11-21
  Administered 2016-11-21: 500 mL via INTRAVENOUS

## 2016-11-21 MED ORDER — PRENATAL MULTIVITAMIN CH
1.0000 | ORAL_TABLET | Freq: Every day | ORAL | Status: DC
  Administered 2016-11-22 – 2016-11-23 (×2): 1 via ORAL
  Filled 2016-11-21 (×2): qty 1

## 2016-11-21 MED ORDER — SODIUM CHLORIDE 0.9 % IV SOLN
2.0000 g | Freq: Once | INTRAVENOUS | Status: AC
  Administered 2016-11-21: 2 g via INTRAVENOUS
  Filled 2016-11-21: qty 2000

## 2016-11-21 MED ORDER — ONDANSETRON HCL 4 MG/2ML IJ SOLN: 4.0000 mg | Freq: Four times a day (QID) | INTRAMUSCULAR | Status: DC | PRN

## 2016-11-21 MED ORDER — PHENYLEPHRINE 40 MCG/ML (10ML) SYRINGE FOR IV PUSH (FOR BLOOD PRESSURE SUPPORT)
80.0000 ug | PREFILLED_SYRINGE | INTRAVENOUS | Status: DC | PRN
  Filled 2016-11-21: qty 5
  Filled 2016-11-21: qty 10

## 2016-11-21 MED ORDER — ACETAMINOPHEN 325 MG PO TABS: 650.0000 mg | ORAL_TABLET | ORAL | Status: DC | PRN

## 2016-11-21 MED ORDER — WITCH HAZEL-GLYCERIN EX PADS: 1.0000 "application " | MEDICATED_PAD | CUTANEOUS | Status: DC | PRN

## 2016-11-21 MED ORDER — PENICILLIN G POT IN DEXTROSE 60000 UNIT/ML IV SOLN
3.0000 10*6.[IU] | Freq: Once | INTRAVENOUS | Status: AC
  Administered 2016-11-21: 3 10*6.[IU] via INTRAVENOUS
  Filled 2016-11-21: qty 50

## 2016-11-21 MED ORDER — ONDANSETRON HCL 4 MG PO TABS: 4.0000 mg | ORAL_TABLET | ORAL | Status: DC | PRN

## 2016-11-21 MED ORDER — DIPHENHYDRAMINE HCL 25 MG PO CAPS: 25.0000 mg | ORAL_CAPSULE | Freq: Four times a day (QID) | ORAL | Status: DC | PRN

## 2016-11-21 MED ORDER — BENZOCAINE-MENTHOL 20-0.5 % EX AERO
1.0000 "application " | INHALATION_SPRAY | CUTANEOUS | Status: DC | PRN
  Administered 2016-11-21 – 2016-11-23 (×3): 1 via TOPICAL
  Filled 2016-11-21 (×3): qty 56

## 2016-11-21 MED ORDER — SIMETHICONE 80 MG PO CHEW: 80.0000 mg | CHEWABLE_TABLET | ORAL | Status: DC | PRN

## 2016-11-21 MED ORDER — FENTANYL CITRATE (PF) 100 MCG/2ML IJ SOLN
100.0000 ug | Freq: Once | INTRAMUSCULAR | Status: AC
  Administered 2016-11-21: 100 ug via INTRAVENOUS

## 2016-11-21 MED ORDER — METFORMIN HCL 500 MG PO TABS
500.0000 mg | ORAL_TABLET | Freq: Two times a day (BID) | ORAL | Status: DC
  Administered 2016-11-21 – 2016-11-23 (×4): 500 mg via ORAL
  Filled 2016-11-21 (×6): qty 1

## 2016-11-21 MED ORDER — LACTATED RINGERS IV SOLN
INTRAVENOUS | Status: DC
  Administered 2016-11-21: 10:00:00 via INTRAVENOUS

## 2016-11-21 MED ORDER — FENTANYL CITRATE (PF) 100 MCG/2ML IJ SOLN
INTRAMUSCULAR | Status: AC
  Filled 2016-11-21: qty 2

## 2016-11-21 MED ORDER — OXYTOCIN 40 UNITS IN LACTATED RINGERS INFUSION - SIMPLE MED
1.0000 m[IU]/min | INTRAVENOUS | Status: DC
  Administered 2016-11-21: 2 m[IU]/min via INTRAVENOUS

## 2016-11-21 MED ORDER — FLEET ENEMA 7-19 GM/118ML RE ENEM
1.0000 | ENEMA | Freq: Once | RECTAL | Status: AC
  Administered 2016-11-21: 1 via RECTAL

## 2016-11-21 MED ORDER — LACTATED RINGERS IV SOLN
500.0000 mL | Freq: Once | INTRAVENOUS | Status: AC
  Administered 2016-11-21: 500 mL via INTRAVENOUS

## 2016-11-21 MED ORDER — IBUPROFEN 600 MG PO TABS
600.0000 mg | ORAL_TABLET | Freq: Four times a day (QID) | ORAL | Status: DC
  Administered 2016-11-21 – 2016-11-23 (×8): 600 mg via ORAL
  Filled 2016-11-21 (×8): qty 1

## 2016-11-21 MED ORDER — ZOLPIDEM TARTRATE 5 MG PO TABS: 5.0000 mg | ORAL_TABLET | Freq: Every evening | ORAL | Status: DC | PRN

## 2016-11-21 MED ORDER — LACTATED RINGERS IV SOLN: 500.0000 mL | Freq: Once | INTRAVENOUS | Status: DC

## 2016-11-21 MED ORDER — FLEET ENEMA 7-19 GM/118ML RE ENEM: 1.0000 | ENEMA | Freq: Once | RECTAL | Status: DC

## 2016-11-21 MED ORDER — SOD CITRATE-CITRIC ACID 500-334 MG/5ML PO SOLN: 30.0000 mL | ORAL | Status: DC | PRN

## 2016-11-21 MED ORDER — ONDANSETRON HCL 4 MG/2ML IJ SOLN: 4.0000 mg | INTRAMUSCULAR | Status: DC | PRN

## 2016-11-21 MED ORDER — OXYCODONE-ACETAMINOPHEN 5-325 MG PO TABS: 1.0000 | ORAL_TABLET | ORAL | Status: DC | PRN

## 2016-11-21 MED ORDER — SENNOSIDES-DOCUSATE SODIUM 8.6-50 MG PO TABS
2.0000 | ORAL_TABLET | ORAL | Status: DC
  Administered 2016-11-21 – 2016-11-22 (×2): 2 via ORAL
  Filled 2016-11-21 (×2): qty 2

## 2016-11-21 MED ORDER — COCONUT OIL OIL
1.0000 "application " | TOPICAL_OIL | Status: DC | PRN
  Administered 2016-11-21: 1 via TOPICAL
  Filled 2016-11-21: qty 120

## 2016-11-21 MED ORDER — TERBUTALINE SULFATE 1 MG/ML IJ SOLN
0.2500 mg | Freq: Once | INTRAMUSCULAR | Status: DC | PRN
  Filled 2016-11-21: qty 1

## 2016-11-21 MED ORDER — DIBUCAINE 1 % RE OINT: 1.0000 "application " | TOPICAL_OINTMENT | RECTAL | Status: DC | PRN

## 2016-11-21 MED ORDER — OXYTOCIN 40 UNITS IN LACTATED RINGERS INFUSION - SIMPLE MED
2.5000 [IU]/h | INTRAVENOUS | Status: DC
  Administered 2016-11-21: 2.5 [IU]/h via INTRAVENOUS
  Filled 2016-11-21: qty 1000

## 2016-11-21 MED ORDER — TETANUS-DIPHTH-ACELL PERTUSSIS 5-2.5-18.5 LF-MCG/0.5 IM SUSP: 0.5000 mL | Freq: Once | INTRAMUSCULAR | Status: DC

## 2016-11-21 MED ORDER — LIDOCAINE HCL (PF) 1 % IJ SOLN
30.0000 mL | INTRAMUSCULAR | Status: DC | PRN
  Administered 2016-11-21: 30 mL via SUBCUTANEOUS
  Filled 2016-11-21: qty 30

## 2016-11-21 NOTE — Anesthesia Postprocedure Evaluation (Signed)
Anesthesia Post Note  Patient: Andrea Diaz  Procedure(s) Performed: * No procedures listed *  Patient location during evaluation: Mother Baby Anesthesia Type: Epidural Level of consciousness: awake and alert and oriented Pain management: pain level controlled Vital Signs Assessment: post-procedure vital signs reviewed and stable Respiratory status: spontaneous breathing and nonlabored ventilation Cardiovascular status: stable Postop Assessment: no headache, patient able to bend at knees, no backache, no signs of nausea or vomiting, epidural receding and adequate PO intake Anesthetic complications: no        Last Vitals:  Vitals:   11/21/16 1615 11/21/16 1745  BP: 132/74 139/74  Pulse: (!) 108 (!) 113  Resp: 18 18  Temp: 36.9 C 36.8 C    Last Pain:  Vitals:   11/21/16 1745  TempSrc: Oral  PainSc: 0-No pain   Pain Goal:                 Andrea Diaz,Andrea Diaz

## 2016-11-21 NOTE — MAU Note (Signed)
Scheduled for induction on Sunday; had chronic constipation with this pregnancy; was on stool softener but has stopped taking that for 3 days; 2 cm today;

## 2016-11-21 NOTE — H&P (Signed)
LABOR AND DELIVERY ADMISSION HISTORY AND PHYSICAL NOTE  Andrea Diaz is a 31 y.o. female G1P0000 with IUP at 48w2dby 8 wk UKoreapresenting for SOL. Patient presented to MAU with irregular contractions q3-4 mins w/o h/o leakage of fluid or bleeding. Endorses positive fetal movement. Cervix 7.5/90/0 with intact membranes and a reassuring fetal strip.  She reports positive fetal movement. She denies leakage of fluid or vaginal bleeding.  Prenatal History/Complications:  Past Medical History: Past Medical History:  Diagnosis Date  . Asthma   . Constipation   . Diabetes mellitus without complication (HCC)    insulin  . Gestational diabetes     Past Surgical History: Past Surgical History:  Procedure Laterality Date  . NO PAST SURGERIES      Obstetrical History: OB History    Gravida Para Term Preterm AB Living   1 0 0 0 0 0   SAB TAB Ectopic Multiple Live Births                  Social History: Social History   Social History  . Marital status: Unknown    Spouse name: N/A  . Number of children: N/A  . Years of education: N/A   Social History Main Topics  . Smoking status: Former Smoker    Types: Cigarettes  . Smokeless tobacco: Former USystems developer . Alcohol use Yes     Comment: occasional before pregnancy  . Drug use: No  . Sexual activity: Yes    Birth control/ protection: None   Other Topics Concern  . None   Social History Narrative  . None    Family History: Family History  Problem Relation Age of Onset  . Hypertension Mother   . Head & neck cancer Father   . Breast cancer Maternal Aunt   . Breast cancer Paternal Aunt     Allergies: No Known Allergies  Prescriptions Prior to Admission  Medication Sig Dispense Refill Last Dose  . ACCU-CHEK SOFTCLIX LANCETS lancets Use as instructed 4 times daily 100 each 12 Taking  . aspirin 81 MG EC tablet Take 81 mg by mouth daily.  2 Taking  . BD PEN NEEDLE NANO U/F 32G X 4 MM MISC USE DAILY WITH INSULIN PENS  1  Taking  . blood glucose meter kit and supplies Dispense based on patient and insurance preference. Use up to four times daily as directed. (FOR ICD-9 250.00, 250.01). 1 each 0 Taking  . docusate sodium (COLACE) 100 MG capsule Take 100 mg by mouth 2 (two) times daily.   Taking  . FREESTYLE LITE test strip Use 4 times daily as instructed. (Patient not taking: Reported on 11/09/2016) 100 each 12 Not Taking  . glucose blood (ACCU-CHEK AVIVA) test strip Use as instructed 4 times daily 100 each 12 Taking  . HUMALOG KWIKPEN 100 UNIT/ML KiwkPen INJECT 5 UNITS UNDER THE SKIN 3 (THREE) TIMES A DAY BEFORE MEALS.  0 Taking  . LANTUS SOLOSTAR 100 UNIT/ML Solostar Pen Inject 48 units under the skin every night at bedtime  2 Taking  . polyethylene glycol (MIRALAX) packet Take 17 g by mouth daily. 14 each 0 Taking  . Prenatal Vit-Fe Fumarate-FA (PNV PRENATAL PLUS MULTIVITAMIN) 27-1 MG TABS Take 1 tablet by mouth daily.  6 Taking     Review of Systems   All systems reviewed and negative except as stated in HPI  Blood pressure (!) 150/57, pulse (!) 134, temperature 98.1 F (36.7 C), temperature source Oral, resp. rate  18, height _0  (1.702 m), weight 271 lb (122.9 kg), last menstrual period 01/30/2016, SpO2 100 %. General appearance: alert, cooperative and no distress Lungs: clear to auscultation bilaterally Heart: regular rate and rhythm Abdomen: soft, non-tender; bowel sounds normal Extremities: No calf swelling or tenderness Presentation: cephalic Fetal monitoring: FHR 150 bpm, moderate variability, accelerations present, no decelerations Uterine activity: irregular Dilation: 7.5 Effacement (%): 90 Station: 0 Exam by:: Eusebio Me, CNM   Prenatal labs: ABO, Rh: --/--/A POS (02/28 0810) Antibody: NEG (02/28 0810) Rubella: Immune RPR: Nonreactive (01/08 0000)  HBsAg: Negative (07/17 0000)  HIV: Non-reactive (01/08 0000)  GBS: Positive (02/16 0000)  1 hr Glucola: Abnormal Genetic screening:   Not performed Anatomy US: Normal  Prenatal Transfer Tool  Maternal Diabetes: Yes:  Diabetes Type:  Pre-pregnancy Genetic Screening: Not performed Maternal Ultrasounds/Referrals: Normal Fetal Ultrasounds or other Referrals:  None Maternal Substance Abuse:  No Significant Maternal Medications:  Meds include: Other: Lantus 48 U QHS and Novolog 5 U TID w/ meals Significant Maternal Lab Results: Lab values include: Group B Strep positive  Results for orders placed or performed during the hospital encounter of 11/21/16 (from the past 24 hour(s))  CBC   Collection Time: 11/21/16  8:10 AM  Result Value Ref Range   WBC 14.1 (H) 4.0 - 10.5 K/uL   RBC 4.45 3.87 - 5.11 MIL/uL   Hemoglobin 11.7 (L) 12.0 - 15.0 g/dL   HCT 34.7 (L) 36.0 - 46.0 %   MCV 78.0 78.0 - 100.0 fL   MCH 26.3 26.0 - 34.0 pg   MCHC 33.7 30.0 - 36.0 g/dL   RDW 14.5 11.5 - 15.5 %   Platelets 263 150 - 400 K/uL  Type and screen [place]   Collection Time: 11/21/16  8:10 AM  Result Value Ref Range   ABO/RH(D) A POS    Antibody Screen NEG    Sample Expiration 11/24/2016   Glucose, capillary   Collection Time: 11/21/16  8:53 AM  Result Value Ref Range   Glucose-Capillary 130 (H) 65 - 99 mg/dL  Glucose, capillary   Collection Time: 11/21/16 12:14 PM  Result Value Ref Range   Glucose-Capillary 125 (H) 65 - 99 mg/dL    Patient Active Problem List   Diagnosis Date Noted  . Diabetes type 2, controlled ([place]) 10/25/2016  . Supervision of high-risk pregnancy 10/25/2016  . Asthma 10/25/2016  . Obesity 10/25/2016  . Psychosocial stressors 10/25/2016  . Carpal tunnel syndrome during pregnancy 10/25/2016  . Hx: UTI (urinary tract infection) 10/25/2016    Assessment: Andrea Diaz is a 31 y.o. G1P0000 at 96w2dhere for SOL.  #Labor:SOL, titrate pitocin to achieve adequate labor #Pain: Epidural #FWB: Category I #ID:  GBS positive on IV Ampicillin #MOF: Breast #MOC:Unsure #Circ:  N/A  DRandolph Bing DO, PGY-1 11/21/2016, 2:30 PM   OB FELLOW HISTORY AND PHYSICAL ATTESTATION  I have seen and examined this patient; I agree with above documentation in the resident's note.    Pateint is a 363G1P0 at 38 wks and 2 days who presents in spontaneous labor. She is feeling a signficant amount of pressure.   Gen: Well appear NAD Pulm: no Resp Distress CV: Regualr rate, intact distal pulses Abd: non tender, gravid Ext: trace edema  FHTS: 140 mod var ractive Toco: contraction q4 mintues  A/P: 31y/o G1P) at 319and 2 in spontaneous labor. Expectant management.  NJacquiline Doe2/28/2018, 3:12 PM

## 2016-11-21 NOTE — MAU Provider Note (Signed)
Andrea Diaz is a 31 y.o. female G1P0000 with IUP at 59w2dpresenting for contractions. Pt states she has been having irregular, every 3-4 minutes contractions, associated with none vaginal bleeding for no hours..  Membranes are intact, with active fetal movement.   [facility]   Prenatal History/Complications: Patient takes 5 units HUmalog with meals and 48 units lantus at HS.  Past Medical History: Past Medical History:  Diagnosis Date  . Asthma   . Constipation   . Diabetes mellitus without complication (HCC)    insulin  . Gestational diabetes     Past Surgical History: Past Surgical History:  Procedure Laterality Date  . NO PAST SURGERIES      Obstetrical History: OB History    Gravida Para Term Preterm AB Living   1 0 0 0 0 0   SAB TAB Ectopic Multiple Live Births                   Social History: Social History   Social History  . Marital status: Unknown    Spouse name: N/A  . Number of children: N/A  . Years of education: N/A   Social History Main Topics  . Smoking status: Former Smoker    Types: Cigarettes  . Smokeless tobacco: Former USystems developer . Alcohol use Yes     Comment: occasional before pregnancy  . Drug use: No  . Sexual activity: Yes    Birth control/ protection: None   Other Topics Concern  . None   Social History Narrative  . None    Family History: Family History  Problem Relation Age of Onset  . Hypertension Mother   . Head & neck cancer Father   . Breast cancer Maternal Aunt   . Breast cancer Paternal Aunt     Allergies: No Known Allergies  Prescriptions Prior to Admission  Medication Sig Dispense Refill Last Dose  . ACCU-CHEK SOFTCLIX LANCETS lancets Use as instructed 4 times daily 100 each 12 Taking  . aspirin 81 MG EC tablet Take 81 mg by mouth daily.  2 Taking  . BD PEN NEEDLE NANO U/F 32G X 4 MM MISC USE DAILY WITH INSULIN PENS  1 Taking  . blood glucose meter kit and supplies Dispense based on  patient and insurance preference. Use up to four times daily as directed. (FOR ICD-9 250.00, 250.01). 1 each 0 Taking  . docusate sodium (COLACE) 100 MG capsule Take 100 mg by mouth 2 (two) times daily.   Taking  . FREESTYLE LITE test strip Use 4 times daily as instructed. (Patient not taking: Reported on 11/09/2016) 100 each 12 Not Taking  . glucose blood (ACCU-CHEK AVIVA) test strip Use as instructed 4 times daily 100 each 12 Taking  . HUMALOG KWIKPEN 100 UNIT/ML KiwkPen INJECT 5 UNITS UNDER THE SKIN 3 (THREE) TIMES A DAY BEFORE MEALS.  0 Taking  . LANTUS SOLOSTAR 100 UNIT/ML Solostar Pen Inject 48 units under the skin every night at bedtime  2 Taking  . polyethylene glycol (MIRALAX) packet Take 17 g by mouth daily. 14 each 0 Taking  . Prenatal Vit-Fe Fumarate-FA (PNV PRENATAL PLUS MULTIVITAMIN) 27-1 MG TABS Take 1 tablet by mouth daily.  6 Taking        Review of Systems   Constitutional: Negative for fever and chills Eyes: Negative for visual disturbances Respiratory: Negative for shortness of breath, dyspnea Cardiovascular: Negative for chest pain or palpitations  Gastrointestinal: Negative for vomiting, diarrhea and  constipation.  POSITIVE for abdominal pain (contractions) Genitourinary: Negative for dysuria and urgency Musculoskeletal: Negative for back pain, joint pain, myalgias  Neurological: Negative for dizziness and headaches      Blood pressure 139/79, pulse (!) 101, temperature 97 F (36.1 C), temperature source Axillary, resp. rate (!) 21, height 5' 7"  (1.702 m), weight 271 lb (122.9 kg), last menstrual period 01/30/2016. General appearance: alert, cooperative and no distress Lungs: clear to auscultation bilaterally Heart: regular rate and rhythm Abdomen: soft, non-tender; bowel sounds normal Extremities: Homans sign is negative, no sign of DVT DTR's 2+ Presentation: cephalic Fetal monitoring  Baseline: 150 bpm Uterine activity  Date/time of onset: November 21, 2016 at 145 am Dilation: 7.5 Effacement (%): 90 Station: 0 Exam by:: Eusebio Me, CNM   Prenatal labs: ABO, Rh: A/Positive/-- (01/08 0000) Antibody: Negative (01/08 0000) Rubella: !Error! RPR: Nonreactive (01/08 0000)  HBsAg: Negative (07/17 0000)  HIV: Non-reactive (01/08 0000)  GBS: Positive (02/16 0000)  1 hr Glucola Previous diabetic Genetic screening   Anatomy US normal  Prenatal Transfer Tool  Maternal Diabetes: Yes:  Diabetes Type:  Pre-pregnancy, Insulin/Medication controlled Genetic Screening: Normal Maternal Ultrasounds/Referrals: Normal Fetal Ultrasounds or other Referrals:  None Maternal Substance Abuse:  No Significant Maternal Medications:  Meds include: Other: insulin Significant Maternal Lab Results: Lab values include: Group B Strep positive     No results found for this or any previous visit (from the past 24 hour(s)).  Assessment: Andrea Diaz is a 31 y.o. G1P0000 with an IUP at 18w2dpresenting for active labor. Patient has a history of asthma (no recent inhaler use in the past year) and Type 2 diabetes. +  Plan: #Labor: expectant management #Pain:  Per request #FWB Cat 1 #ID: GBS: positive  #MOF:  breast #MOC: undecided #Circ: female fetus   KStarr LakeCNM 11/21/2016, 8:31 AM

## 2016-11-21 NOTE — Anesthesia Preprocedure Evaluation (Signed)
Anesthesia Evaluation  Patient identified by MRN, date of birth, ID band Patient awake    Reviewed: Allergy & Precautions, NPO status , Patient's Chart, lab work & pertinent test results  Airway Mallampati: III       Dental   Pulmonary asthma , former smoker,    Pulmonary exam normal        Cardiovascular negative cardio ROS Normal cardiovascular exam     Neuro/Psych negative neurological ROS     GI/Hepatic negative GI ROS, Neg liver ROS,   Endo/Other  diabetesMorbid obesity  Renal/GU negative Renal ROS     Musculoskeletal   Abdominal   Peds  Hematology negative hematology ROS (+)   Anesthesia Other Findings   Reproductive/Obstetrics (+) Pregnancy                             Lab Results  Component Value Date   WBC 14.1 (H) 11/21/2016   HGB 11.7 (L) 11/21/2016   HCT 34.7 (L) 11/21/2016   MCV 78.0 11/21/2016   PLT 263 11/21/2016    Anesthesia Physical Anesthesia Plan  ASA: III  Anesthesia Plan: Epidural   Post-op Pain Management:    Induction:   Airway Management Planned: Natural Airway  Additional Equipment:   Intra-op Plan:   Post-operative Plan:   Informed Consent: I have reviewed the patients History and Physical, chart, labs and discussed the procedure including the risks, benefits and alternatives for the proposed anesthesia with the patient or authorized representative who has indicated his/her understanding and acceptance.     Plan Discussed with:   Anesthesia Plan Comments:         Anesthesia Quick Evaluation

## 2016-11-21 NOTE — MAU Note (Signed)
PT  OUT OF B-ROOM- TO ROOM - WAS GOING TO PUT ON MONITOR - BUT WHEN SHE LAYED  DOWN - SHE JUMPED UP- SAID I HAVE TO GO BACK TO B-ROOM

## 2016-11-21 NOTE — MAU Note (Signed)
Pt presents stating she feels like she has to have a bowel movement. Pt brought straight back to a bed to be assessed

## 2016-11-21 NOTE — Anesthesia Procedure Notes (Signed)
Epidural Patient location during procedure: OB Start time: 11/21/2016 9:15 AM End time: 11/21/2016 9:25 AM  Staffing Anesthesiologist: Marcene DuosFITZGERALD, Nazly Digilio Performed: anesthesiologist   Preanesthetic Checklist Completed: patient identified, site marked, surgical consent, pre-op evaluation, timeout performed, IV checked, risks and benefits discussed and monitors and equipment checked  Epidural Patient position: sitting Prep: site prepped and draped and DuraPrep Patient monitoring: continuous pulse ox and blood pressure Approach: midline Location: L3-L4 Injection technique: LOR air  Needle:  Needle type: Tuohy  Needle gauge: 17 G Needle length: 9 cm and 9 Needle insertion depth: 8 cm Catheter type: closed end flexible Catheter size: 19 Gauge Catheter at skin depth: 15 (14-->15cm when laid in lat decub position.) cm Test dose: negative  Assessment Events: blood not aspirated, injection not painful, no injection resistance, negative IV test and no paresthesia

## 2016-11-21 NOTE — Progress Notes (Signed)
Patient seen doing well no feeling some pressure now with epidural. No other complaints. AROM for minimal fluid. 9 cm, Expectant management.

## 2016-11-21 NOTE — Lactation Note (Signed)
This note was copied from a baby's chart. Lactation Consultation Note  Patient Name: Andrea Diaz: 11/21/2016 Reason for consult: Initial assessment   Initial assessment with first time mom of < 1 hour old infant. Mom reports she plans to BF for 9 months. Mom GDM-Insulin Dependant.  Infant STS with mom and cueing to feed. Assisted mom in latching infant to left breast in the cross cradle hold. Mom with large compressible breasts and short shaft everted nipples. Assisted mom in using the tea cup hold to latch infant. Infant latched easily with flanged lips, rhythmic suckles and intermittent swallows. Enc mom to feed infant STS 8-12 x in 24 hours at first feeding cues. Showed mom how to hand express and colostrum easily compressible. Mom reports she has been hand expressing at home prior to delivery. Attempted  to latch infant to right breast and she was not interested. About 5 minutes later infant cueing to feed, assisted mom in latching infant to right breast in the cross cradle hold using the tea cup hold. Infant actively feeding when I left the room.   Discussed BF basics, positioning, head and pillow support with feeding, colostrum, infant stomach size and milk coming to volume. Enc mom to call out for feeding assistance as needed.   BF Resources Handout and LC Brochure given, mom informed of IP/OP Services, BF Support Groups and LC phone #. [facility] and [facility] scheduled for 3/15.     Maternal Data Formula Feeding for Exclusion: No Has patient been taught Hand Expression?: Yes Does the patient have breastfeeding experience prior to this delivery?: No  Feeding Feeding Type: Breast Fed Length of feed: 15 min  LATCH Score/Interventions Latch: Grasps breast easily, tongue down, lips flanged, rhythmical sucking.  Audible Swallowing: A few with stimulation Intervention(s): Skin to skin;Hand expression;Alternate breast massage  Type of Nipple: Everted at  rest and after stimulation  Comfort (Breast/Nipple): Soft / non-tender     Hold (Positioning): Assistance needed to correctly position infant at breast and maintain latch. Intervention(s): Breastfeeding basics reviewed;Support Pillows;Position options;Skin to skin  LATCH Score: 8  Lactation Tools Discussed/Used WIC Program: Yes   Consult Status Consult Status: Follow-up Diaz: 11/22/16 Follow-up type: In-patient    Silas FloodSharon S Khayla Koppenhaver 11/21/2016, 3:14 PM

## 2016-11-22 ENCOUNTER — Encounter (HOSPITAL_COMMUNITY): Payer: Self-pay

## 2016-11-22 ENCOUNTER — Ambulatory Visit (HOSPITAL_COMMUNITY): Payer: Medicaid Other

## 2016-11-22 ENCOUNTER — Ambulatory Visit (HOSPITAL_COMMUNITY): Admission: RE | Admit: 2016-11-22 | Payer: Medicaid Other | Source: Ambulatory Visit

## 2016-11-22 ENCOUNTER — Other Ambulatory Visit: Payer: Medicaid Other | Admitting: Obstetrics & Gynecology

## 2016-11-22 LAB — RPR: RPR: NONREACTIVE

## 2016-11-22 LAB — GLUCOSE, CAPILLARY
GLUCOSE-CAPILLARY: 118 mg/dL — AB (ref 65–99)
GLUCOSE-CAPILLARY: 99 mg/dL (ref 65–99)
GLUCOSE-CAPILLARY: 152 mg/dL — AB (ref 65–99)
Glucose-Capillary: 112 mg/dL — ABNORMAL HIGH (ref 65–99)

## 2016-11-22 NOTE — Plan of Care (Signed)
Problem: Education: Goal: Knowledge of condition will improve Outcome: Progressing Discussed skin to skin and the benefits; attempted to latch infant but was unsuccessful.  Mom's food came during attempt; will eat and then hand express colostrum to feed infant.    Mom voiding without issues; bleeding scant.

## 2016-11-22 NOTE — Lactation Note (Signed)
This note was copied from a baby's chart. Lactation Consultation Note Mom called for latch assistance. Baby not interested in BF. Will occasionally cue, then put on breast, will not suckle, go to sleep. Discussed newborn feeding habits. Encouraged STS, hand expression and spoon feeding. Monitoring for s/sx of low blood sugars d/ mom IDDM. Patient Name: [name]'UToday's Date: 11/22/2016 Reason for consult: Follow-up assessment   Maternal Data    Feeding Feeding Type: Breast Fed Length of feed: 0 min (few sucks)  LATCH Score/Interventions Latch: Too sleepy or reluctant, no latch achieved, no sucking elicited.     Type of Nipple: Flat  Comfort (Breast/Nipple): Soft / non-tender           Lactation Tools Discussed/Used     Consult Status Consult Status: Follow-up Date: 11/22/16 Follow-up type: In-patient    Briani Maul, Diamond NickelLAURA G 11/22/2016, 2:02 AM

## 2016-11-22 NOTE — Progress Notes (Signed)
Post Partum Day #1 s/p NSVD Subjective: no complaints, up ad lib, voiding and tolerating PO  Objective: Blood pressure 118/75, pulse 90, temperature 98.4 F (36.9 C), resp. rate 18, height 5\' 7"  (1.702 m), weight 122.9 kg (271 lb), last menstrual period 01/30/2016, SpO2 99 %, unknown if currently breastfeeding.  Physical Exam:  General: alert Lochia: appropriate Uterine Fundus: firm and NT at U-1 DVT Evaluation: No evidence of DVT seen on physical exam.   Recent Labs  11/21/16 0810  HGB 11.7*  HCT 34.7*    Assessment/Plan: Plan for discharge tomorrow  She will need to sign [facility] BTL forms as she would like an interval BTL.   LOS: 1 day   Andrea Diaz C Andrea Diaz 11/22/2016, 7:46 AM

## 2016-11-22 NOTE — Lactation Note (Signed)
This note was copied from a baby's chart. Lactation Consultation Note  Patient Name: Andrea Diaz UJWJX'BToday's Date: 11/22/2016 Reason for consult: Follow-up assessment Baby at 28 hr of life. RN called because baby has not been eating. Baby has a small gape, and thick, tight upper labial frenulum with a notched insertion point at the bottom of the gum ridge. Baby does not flange lips well but does have nice peristolic tongue movement. Mom has large soft breast with flat nipples. Baby can latch but needs continued support to maintain bursts of sucking. Applied #20 NS and baby was able to stay on the breast unassisted. Mom will bf on demand 8+/24hr. She will try to latch without the NS, but if she does use it she will post pump with her personal DEBP. She would rather use her pump than the hospital's. She is aware of lactation services and support group.   Maternal Data    Feeding Feeding Type: Breast Fed Length of feed: 15 min  LATCH Score/Interventions Latch: Repeated attempts needed to sustain latch, nipple held in mouth throughout feeding, stimulation needed to elicit sucking reflex. Intervention(s): Skin to skin Intervention(s): Adjust position;Assist with latch;Breast compression  Audible Swallowing: A few with stimulation Intervention(s): Hand expression;Skin to skin Intervention(s): Alternate breast massage  Type of Nipple: Flat  Comfort (Breast/Nipple): Soft / non-tender     Hold (Positioning): Full assist, staff holds infant at breast Intervention(s): Position options  LATCH Score: 5  Lactation Tools Discussed/Used Tools: Nipple Shields Nipple shield size: 20   Consult Status Consult Status: Follow-up Date: 11/23/16 Follow-up type: In-patient    [name] 11/22/2016, 8:03 PM

## 2016-11-23 ENCOUNTER — Encounter (HOSPITAL_COMMUNITY): Payer: Self-pay

## 2016-11-23 LAB — GLUCOSE, CAPILLARY
Glucose-Capillary: 113 mg/dL — ABNORMAL HIGH (ref 65–99)
Glucose-Capillary: 125 mg/dL — ABNORMAL HIGH (ref 65–99)

## 2016-11-23 MED ORDER — METFORMIN HCL 500 MG PO TABS: 500.0000 mg | ORAL_TABLET | Freq: Two times a day (BID) | ORAL | 1 refills | Status: DC

## 2016-11-23 MED ORDER — IBUPROFEN 600 MG PO TABS: 600.0000 mg | ORAL_TABLET | Freq: Four times a day (QID) | ORAL | 0 refills | Status: AC

## 2016-11-23 NOTE — Lactation Note (Signed)
This note was copied from a baby's chart. Lactation Consultation Note  Patient Name: Andrea Diaz'UToday's Date: 11/23/2016 Reason for consult: Follow-up assessment Baby 48 hours old. Mom reports that the baby has been latching and nursing for 10-30 minutes and sometimes up to 60 minutes, but needs a lot of stimulation to continue nursing. Mom able to latch baby without NS and baby nursed well for 10 minutes on left breast in football position. Baby sleepy after 10 minutes and then would only nurse off-and-on with stimulation. Assisted mom with hand expression and spoon feeding, but mom only able to collect a few drops. Mom given supplementation guidelines and agreeable to supplementing baby with formula. Assisted mom to supplement baby 15 ml of Alimentum using curve-tipped syringe and finger and baby tolerated well. Enc mom to increase feeding amounts over the next few feedings.  Plan is for mom to put baby to breast first, without NS as much as possible, then supplement with EBM/formula according to guidelines--increasing as needed to satisfy baby. Enc mom to post-pump followed by hand expression. Discussed the need to keep pumping until baby nursing well at breast and not using NS. Mom aware of OP/BFSG and LC phone line assistance after D/C.   Discussed assessment and interventions with patient's bedside nurse, Morrie SheldonAshley, RN.   Maternal Data    Feeding Feeding Type: Breast Fed Length of feed: 10 min  LATCH Score/Interventions Latch: Grasps breast easily, tongue down, lips flanged, rhythmical sucking. Intervention(s): Skin to skin;Waking techniques Intervention(s): Adjust position;Assist with latch;Breast compression  Audible Swallowing: None Intervention(s): Skin to skin;Hand expression  Type of Nipple: Everted at rest and after stimulation Intervention(s): Double electric pump  Comfort (Breast/Nipple): Soft / non-tender     Hold (Positioning): Assistance needed to correctly  position infant at breast and maintain latch. Intervention(s): Breastfeeding basics reviewed;Support Pillows;Position options;Skin to skin  LATCH Score: 7  Lactation Tools Discussed/Used     Consult Status      Sherlyn HayJennifer D Olean Sangster 11/23/2016, 3:36 PM

## 2016-11-23 NOTE — Lactation Note (Signed)
This note was copied from a baby's chart. Lactation Consultation Note P1 mom with large breast, flat nipples,  but compressible breast tissue/having difficulty with infant latching without NS.  Infant has been feeding with #20 NS.  LC taught mom how to roll linens under breast for added support and to stabilize baby's target.  Mom encouraged to use more pillows in order to prevent baby from sliding down into gap of boppy pillow.  LC assisted in latching infant without NS first and baby was able to grasp breast and latch but would not stay active at the breast and there was not a rhythmic jaw movement noted.  LC then had mom latch infant with NS and baby latched easily had good jaw movement.  LC hand expressed 2 ml of colostrum into a spoon from alternate breast while baby fed.  Mom encouraged to use breast massage and to keep infant active while eating.  LC finger fed colostrum after baby was off the breast.  Mom states she feels baby's suck and feeding at breast has improved since yesterday.    Plan is for mom to pump with DEBP after each feed and to feed back to infant whatever she gets out and to also hand express into a spoon in order to get colostrum.  LC emphasized the importance of pumping while using the NS.  Pt. Is to call out with question or concerns regarding feeds and feeding colostrum back to infant.  LC discussed different options of feeding back to infant; spoon/curved tip syringe/finger/NS.   Pt will feed on demand.  LC will continue to monitor feeds, I and o's and amt. Of milk pumped from DEBP/hand expression for the rest of the day and communicate this with pediatrician,..      Patient Name: [name] ZOXWR'UToday's Date: 11/23/2016 Reason for consult: Follow-up assessment   Maternal Data Formula Feeding for Exclusion: No  Feeding Feeding Type: Breast Fed  LATCH Score/Interventions Latch: Grasps breast easily, tongue down, lips flanged, rhythmical sucking. (2 grasps breast well  with NS but without does not keep a rhythmic suck) Intervention(s): Teach feeding cues;Skin to skin Intervention(s): Adjust position;Assist with latch;Breast compression;Breast massage (easily compressible/baby latched without shield but then stopped actively sucking)  Audible Swallowing: A few with stimulation Intervention(s): Skin to skin (hand expressed 2 ml into spoon while infant at other breast; finger fed 2ml) Intervention(s): Skin to skin;Hand expression  Type of Nipple: Flat Intervention(s): Double electric pump (#20 NS) Intervention(s):  (pt. to postpump after each feed)  Comfort (Breast/Nipple): Soft / non-tender     Hold (Positioning): Assistance needed to correctly position infant at breast and maintain latch. (linen rolled up under breast to help support) Intervention(s): Breastfeeding basics reviewed;Support Pillows;Position options;Skin to skin  LATCH Score: 7  Lactation Tools Discussed/Used Tools: Nipple Dorris CarnesShields;Pump Nipple shield size: 20 Breast pump type: Double-Electric Breast Pump   Consult Status Consult Status: Follow-up Date: 11/23/16 Follow-up type: In-patient    Andrea Diaz 11/23/2016, 11:33 AM

## 2016-11-23 NOTE — Progress Notes (Addendum)
CLINICAL SOCIAL WORK MATERNAL/CHILD NOTE  Patient Details  Name: Andrea Diaz MRN: [id number] Date of Birth: 07/11/1986  Date:  11/23/2016  Clinical Social Worker Initiating Note:  Terri Piedra, [place] Date/ Time Initiated:  11/23/16/1130     Child's Name:  Andrea Diaz   Legal Guardian:  Mother [name])   Need for Interpreter:  None   Date of Referral:  11/22/16     Reason for Referral:  Other (Comment) ("New to area, hx of unemployment, hx of depression and housing instability with this pregnancy.")   Referral Source:  [facility]   Address:  [address] [place], [place] [address]  Phone number:  [phone]   Household Members:  Parents (MOB states she lives in her mother's guest house.)   Natural Supports (not living in the home):  Friends, Parent, Extended Family, Immediate Family (MOB reports that her support system is in [place].  Her mother is in [place], where MOB and baby will be living.  MOB is in [place] now because her brother and best freind live here.)   Professional Supports:  (MOB reports that she has resources for counseling in her area if she decides she would like mental health support at any time.)   Employment:  (MOB is currently receiving unemployment)   Type of Work:     Education:      Pensions consultant:  [employer]   Other Resources:   (MOB plans to file for Child Support)   Cultural/Religious Considerations Which May Impact Care: None stated.    Strengths:  Ability to meet basic needs , Home prepared for child , Pediatrician chosen  [facility])   Risk Factors/Current Problems:  None   Cognitive State:  Able to Concentrate , Alert , Insightful , Linear Thinking , Goal Oriented    Mood/Affect:  Calm , Comfortable , Euthymic , Interested    CSW Assessment: CSW met with MOB in her first floor room/143 to offer support and complete assessment due to consult for hx of depression.  MOB was pleasant and welcoming  of CSW's visit.  She had her friend Caryl Pina with her, who introduced herself to [place] as "best friend and God Mommy."  MOB provided permission to speak openly with Caryl Pina present.  CSW found MOB easy to engage and open about her history. MOB reports she grew up in [place] and recently relocated back to the area [employer]house [employer]) at the end of January after living in [place] for 10 years.  She reports her mother and other support people are here and her father, with whom she has a good relationship lives in [place].  MOB reports that she "lost my mind" during her first year of college at [employer] over ten years ago.  She states she became homeless, jobless and failed out of college.  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  She states she spent a weekend in jail and finally had her eyes opened to her situation.  She entered an 87 month inpatient substance use treatment facility in [place] and states, "I stayed for 2 years."  When she completed treatment, she decided to start her life over in [place].   MOB states she and FOB had been in a relationship 4-5 months when she found out she was pregnant.  She states the relationship ended at that time.  She states FOB had talked about wanting to be involved  up until 2 weeks ago, when he informed MOB that he did not want anything to do with her or the baby.  MOB reports that this was stressful, but that she never depended on him for anything and states, "he will not be a source of contention for me."  She plans to file for Child Support and states she is willing to have him involved at any time if he chooses to do so.   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  She states she had already decided she wanted to move back to [place] for additional support after  her lease was up in 9 months, but decided that she needed to move home immediately due to her loss of income.  She states she was able to get out of her lease, although she lost the money she had already paid as a deposit.  She states coming home was the best decision she made and that she feels very well supported. MOB disclosed that she experienced perinatal depression "very bad."  She states she did not bond with the baby while pregnant and felt like there was something wrong with her.  When asked how she is feeling about being a mother now that baby is here, she replied, "I'm completely obsessed with her."  She added, "I'm not scared anymore."  She was receptive to education regarding PMADs and agreed to notify a medical professional if she has concerns at any time.  CSW provided her with a "New Mom Checklist," which she appreciated.  MOB states she has mental health resources in [place] already if needed.   CSW asked MOB what made her decide to deliver in [place] when she is living in [place].  She informed CSW that her best friend and brother live in [place] and after doing research on various hospitals to deliver, [facility].  She reports she has been extremely satisfied with her choice for care.   CSW identifies no further interventions needed and no barriers to discharge when MOB and baby are medically ready.  CSW Plan/Description:  No Further Intervention Required/No Barriers to Discharge, Patient/Family Education     Nhan Qualley Elizabeth, LCSW 11/23/2016, 5:11 PM 

## 2016-11-23 NOTE — Discharge Summary (Signed)
Obstetric Discharge Summary Reason for Admission: SOL at 38+ weeks EGA, A2GDM,morbid obesity Prenatal Procedures: NST and ultrasound Intrapartum Procedures: spontaneous vaginal delivery Postpartum Procedures: none Complications-Operative and Postpartum: none Hemoglobin  Date Value Ref Range Status  11/21/2016 11.7 (L) 12.0 - 15.0 g/dL Final  56/38/756401/24/2018 33.211.5 g/dL Final   HCT  Date Value Ref Range Status  11/21/2016 34.7 (L) 36.0 - 46.0 % Final  10/17/2016 35 % Final    Physical Exam:  General: alert Lochia: appropriate Uterine Fundus: firm and NT at U-1 DVT Evaluation: No evidence of DVT seen on physical exam.  Discharge Diagnoses: Term Pregnancy-delivered and GDMA2  Discharge Information: Date: 11/23/2016 Activity: pelvic rest Diet: routine Medications: PNV and Ibuprofen Condition: stable Instructions: refer to practice specific booklet Discharge to: home  She is scheduled for an interval BTL. She signed her papers 11-22-16. Follow-up Information    Allie BossierMyra C Sharlynn Seckinger, MD. Schedule an appointment as soon as possible for a visit in 4 week(s).   Specialty:  Obstetrics and Gynecology Why:  Bring recorded sugars. Contact information: [address] [place] [id number] [phone]           Newborn Data: Live born female  Birth Weight: 7 lb 11.8 oz (3510 g) APGAR: 8, 9  Home with mother.  Allie BossierMyra C Lynlee Stratton 11/23/2016, 7:24 AM

## 2016-11-23 NOTE — Discharge Instructions (Signed)

## 2016-11-26 ENCOUNTER — Inpatient Hospital Stay (HOSPITAL_COMMUNITY): Admission: RE | Admit: 2016-11-26 | Payer: Medicaid Other | Source: Ambulatory Visit

## 2016-12-19 NOTE — Patient Instructions (Signed)
Your procedure is scheduled on:  Tuesday, December 25, 2016  [facility] at:  10:45 AM  Pick up the phone at the desk and dial [phone].  Call this number if you have problems the morning of surgery: [phone].  Remember: Do NOT eat food:  After Midnight Monday  Do NOT drink clear liquids after:  8:00 AM day of surgery  Take these medicines the morning of surgery with a SIP OF WATER:  None  Do Not take evening dose of Metformin the night before surgery  Bring Asthma Inhaler day of surgery  Stop ALL herbal medications and Ibuprofen at this time  Do NOT smoke the day of surgery.  Do NOT wear jewelry (body piercing), metal hair clips/bobby pins, make-up, or nail polish. Do NOT wear lotions, powders, or perfumes.  You may wear deodorant. Do NOT shave for 48 hours prior to surgery. Do NOT bring valuables to the hospital. Contacts, dentures, or bridgework may not be worn into surgery.  Have a responsible adult drive you home and stay with you for 24 hours after your procedure  Bring a copy of your healthcare power of attorney and living will documents.

## 2016-12-20 ENCOUNTER — Encounter (HOSPITAL_COMMUNITY): Payer: Self-pay

## 2016-12-20 ENCOUNTER — Ambulatory Visit: Payer: Medicaid Other | Admitting: Family Medicine

## 2016-12-20 ENCOUNTER — Encounter (HOSPITAL_COMMUNITY)
Admission: RE | Admit: 2016-12-20 | Discharge: 2016-12-20 | Disposition: A | Discharge status: Home / Self Care | Payer: Medicaid Other | Source: Ambulatory Visit | Attending: Obstetrics & Gynecology | Admitting: Obstetrics & Gynecology

## 2016-12-20 DIAGNOSIS — Z01818 Encounter for other preprocedural examination: Secondary | ICD-10-CM | POA: Insufficient documentation

## 2016-12-20 HISTORY — DX: Prediabetes: R73.03

## 2016-12-20 HISTORY — DX: Carpal tunnel syndrome, bilateral upper limbs: G56.03

## 2016-12-20 LAB — CBC
HCT: 37.1 % (ref 36.0–46.0)
Hemoglobin: 12 g/dL (ref 12.0–15.0)
MCH: 25.9 pg — AB (ref 26.0–34.0)
MCHC: 32.3 g/dL (ref 30.0–36.0)
MCV: 80 fL (ref 78.0–100.0)
PLATELETS: 263 10*3/uL (ref 150–400)
RBC: 4.64 MIL/uL (ref 3.87–5.11)
RDW: 15.1 % (ref 11.5–15.5)
WBC: 7 10*3/uL (ref 4.0–10.5)

## 2016-12-20 LAB — BASIC METABOLIC PANEL
Anion gap: 8 (ref 5–15)
BUN: 8 mg/dL (ref 6–20)
CHLORIDE: 106 mmol/L (ref 101–111)
CO2: 26 mmol/L (ref 22–32)
Calcium: 9 mg/dL (ref 8.9–10.3)
Creatinine, Ser: 0.85 mg/dL (ref 0.44–1.00)
GFR calc Af Amer: >60 mL/min (ref >60)
GLUCOSE: 101 mg/dL — AB (ref 65–99)
POTASSIUM: 3.8 mmol/L (ref 3.5–5.1)
Sodium: 140 mmol/L (ref 135–145)

## 2016-12-20 NOTE — Progress Notes (Signed)
Pt came to the front office and wanted to know if she still needed to keep her appt scheduled for today for her BS because [facility]  [facility] me [facility] Everywhere.  Notified Dr. Burnice LoganHarrawayKatrinka Blazing- Smith who recommend that since she is being followed by her PCP she did not need appt scheduled for today.  Informed pt of providers recommendation.  Pt stated thank you.

## 2016-12-25 ENCOUNTER — Encounter (HOSPITAL_COMMUNITY): Payer: Self-pay

## 2016-12-25 ENCOUNTER — Ambulatory Visit (HOSPITAL_COMMUNITY)
Admission: RE | Admit: 2016-12-25 | Discharge: 2016-12-25 | Disposition: A | Discharge status: Home / Self Care | Payer: Medicaid Other | Source: Ambulatory Visit | Attending: Obstetrics & Gynecology | Admitting: Obstetrics & Gynecology

## 2016-12-25 ENCOUNTER — Encounter (HOSPITAL_COMMUNITY)
Admission: RE | Disposition: A | Discharge status: Home / Self Care | Payer: Self-pay | Source: Ambulatory Visit | Attending: Obstetrics & Gynecology

## 2016-12-25 ENCOUNTER — Ambulatory Visit (HOSPITAL_COMMUNITY): Payer: Medicaid Other | Admitting: Anesthesiology

## 2016-12-25 DIAGNOSIS — J45909 Unspecified asthma, uncomplicated: Secondary | ICD-10-CM | POA: Insufficient documentation

## 2016-12-25 DIAGNOSIS — E119 Type 2 diabetes mellitus without complications: Secondary | ICD-10-CM | POA: Insufficient documentation

## 2016-12-25 DIAGNOSIS — Z302 Encounter for sterilization: Secondary | ICD-10-CM | POA: Diagnosis present

## 2016-12-25 DIAGNOSIS — G5603 Carpal tunnel syndrome, bilateral upper limbs: Secondary | ICD-10-CM | POA: Insufficient documentation

## 2016-12-25 DIAGNOSIS — Z87891 Personal history of nicotine dependence: Secondary | ICD-10-CM | POA: Insufficient documentation

## 2016-12-25 HISTORY — PX: LAPAROSCOPIC TUBAL LIGATION: SHX1937

## 2016-12-25 LAB — GLUCOSE, CAPILLARY
Glucose-Capillary: 83 mg/dL (ref 65–99)
Glucose-Capillary: 111 mg/dL — ABNORMAL HIGH (ref 65–99)

## 2016-12-25 LAB — PREGNANCY, URINE: Preg Test, Ur: NEGATIVE

## 2016-12-25 SURGERY — LIGATION, FALLOPIAN TUBE, LAPAROSCOPIC
Anesthesia: General | Site: Abdomen | Laterality: Bilateral

## 2016-12-25 MED ORDER — BUPIVACAINE HCL (PF) 0.5 % IJ SOLN
INTRAMUSCULAR | Status: AC
  Filled 2016-12-25: qty 30

## 2016-12-25 MED ORDER — OXYCODONE HCL 5 MG PO TABS: 5.0000 mg | ORAL_TABLET | Freq: Once | ORAL | 0 refills | Status: AC | PRN

## 2016-12-25 MED ORDER — SCOPOLAMINE 1 MG/3DAYS TD PT72
MEDICATED_PATCH | TRANSDERMAL | Status: DC
Start: 2016-12-25 — End: 2016-12-25
  Administered 2016-12-25: 1.5 mg via TRANSDERMAL
  Filled 2016-12-25: qty 1

## 2016-12-25 MED ORDER — LIDOCAINE HCL (CARDIAC) 20 MG/ML IV SOLN
INTRAVENOUS | Status: DC | PRN
  Administered 2016-12-25: 70 mg via INTRAVENOUS
  Administered 2016-12-25: 30 mg via INTRAVENOUS

## 2016-12-25 MED ORDER — SUGAMMADEX SODIUM 200 MG/2ML IV SOLN
INTRAVENOUS | Status: DC | PRN
  Administered 2016-12-25: 200 mg via INTRAVENOUS

## 2016-12-25 MED ORDER — SUGAMMADEX SODIUM 200 MG/2ML IV SOLN
INTRAVENOUS | Status: AC
  Filled 2016-12-25: qty 2

## 2016-12-25 MED ORDER — OXYCODONE HCL 5 MG PO TABS
ORAL_TABLET | ORAL | Status: AC
  Filled 2016-12-25: qty 1

## 2016-12-25 MED ORDER — KETOROLAC TROMETHAMINE 30 MG/ML IJ SOLN: 30.0000 mg | Freq: Once | INTRAMUSCULAR | Status: DC | PRN

## 2016-12-25 MED ORDER — HYDROMORPHONE HCL 1 MG/ML IJ SOLN
0.2500 mg | INTRAMUSCULAR | Status: DC | PRN
Start: 2016-12-25 — End: 2016-12-25
  Administered 2016-12-25: 0.5 mg via INTRAVENOUS

## 2016-12-25 MED ORDER — DEXAMETHASONE SODIUM PHOSPHATE 10 MG/ML IJ SOLN
INTRAMUSCULAR | Status: DC | PRN
  Administered 2016-12-25: 4 mg via INTRAVENOUS

## 2016-12-25 MED ORDER — OXYCODONE HCL 5 MG/5ML PO SOLN: 5.0000 mg | Freq: Once | ORAL | Status: AC | PRN

## 2016-12-25 MED ORDER — SCOPOLAMINE 1 MG/3DAYS TD PT72
1.0000 | MEDICATED_PATCH | Freq: Once | TRANSDERMAL | Status: DC
  Administered 2016-12-25: 1.5 mg via TRANSDERMAL

## 2016-12-25 MED ORDER — HYDROMORPHONE HCL 1 MG/ML IJ SOLN
INTRAMUSCULAR | Status: AC
  Administered 2016-12-25: 0.5 mg via INTRAVENOUS
  Filled 2016-12-25: qty 1

## 2016-12-25 MED ORDER — DEXAMETHASONE SODIUM PHOSPHATE 4 MG/ML IJ SOLN
INTRAMUSCULAR | Status: AC
  Filled 2016-12-25: qty 1

## 2016-12-25 MED ORDER — PROMETHAZINE HCL 25 MG/ML IJ SOLN
6.2500 mg | INTRAMUSCULAR | Status: DC | PRN
Start: 2016-12-25 — End: 2016-12-25

## 2016-12-25 MED ORDER — FENTANYL CITRATE (PF) 100 MCG/2ML IJ SOLN
INTRAMUSCULAR | Status: DC | PRN
  Administered 2016-12-25 (×2): 50 ug via INTRAVENOUS

## 2016-12-25 MED ORDER — LACTATED RINGERS IV SOLN
INTRAVENOUS | Status: DC
  Administered 2016-12-25: 125 mL/h via INTRAVENOUS
  Administered 2016-12-25: 10:00:00 via INTRAVENOUS

## 2016-12-25 MED ORDER — FENTANYL CITRATE (PF) 100 MCG/2ML IJ SOLN
INTRAMUSCULAR | Status: AC
  Filled 2016-12-25: qty 2

## 2016-12-25 MED ORDER — MIDAZOLAM HCL 2 MG/2ML IJ SOLN
INTRAMUSCULAR | Status: AC
  Filled 2016-12-25: qty 2

## 2016-12-25 MED ORDER — KETOROLAC TROMETHAMINE 30 MG/ML IJ SOLN
INTRAMUSCULAR | Status: AC
  Filled 2016-12-25: qty 1

## 2016-12-25 MED ORDER — PROPOFOL 10 MG/ML IV BOLUS
INTRAVENOUS | Status: DC | PRN
Start: 2016-12-25 — End: 2016-12-25
  Administered 2016-12-25: 200 mg via INTRAVENOUS

## 2016-12-25 MED ORDER — OXYCODONE HCL 5 MG PO TABS
5.0000 mg | ORAL_TABLET | Freq: Once | ORAL | Status: AC | PRN
  Administered 2016-12-25: 5 mg via ORAL

## 2016-12-25 MED ORDER — LIDOCAINE HCL (CARDIAC) 20 MG/ML IV SOLN
INTRAVENOUS | Status: AC
  Filled 2016-12-25: qty 5

## 2016-12-25 MED ORDER — ROCURONIUM BROMIDE 100 MG/10ML IV SOLN
INTRAVENOUS | Status: DC | PRN
  Administered 2016-12-25: 35 mg via INTRAVENOUS

## 2016-12-25 MED ORDER — ROCURONIUM BROMIDE 100 MG/10ML IV SOLN
INTRAVENOUS | Status: AC
  Filled 2016-12-25: qty 1

## 2016-12-25 MED ORDER — BUPIVACAINE HCL (PF) 0.5 % IJ SOLN
INTRAMUSCULAR | Status: DC | PRN
  Administered 2016-12-25: 4 mL

## 2016-12-25 MED ORDER — PROPOFOL 10 MG/ML IV BOLUS
INTRAVENOUS | Status: AC
  Filled 2016-12-25: qty 20

## 2016-12-25 MED ORDER — KETOROLAC TROMETHAMINE 30 MG/ML IJ SOLN
INTRAMUSCULAR | Status: DC | PRN
  Administered 2016-12-25: 30 mg via INTRAVENOUS

## 2016-12-25 MED ORDER — ONDANSETRON HCL 4 MG/2ML IJ SOLN
INTRAMUSCULAR | Status: AC
  Filled 2016-12-25: qty 2

## 2016-12-25 MED ORDER — SILVER NITRATE-POT NITRATE 75-25 % EX MISC
CUTANEOUS | Status: AC
  Filled 2016-12-25: qty 1

## 2016-12-25 MED ORDER — ONDANSETRON HCL 4 MG/2ML IJ SOLN
INTRAMUSCULAR | Status: DC | PRN
  Administered 2016-12-25: 4 mg via INTRAVENOUS

## 2016-12-25 MED ORDER — MIDAZOLAM HCL 2 MG/2ML IJ SOLN
INTRAMUSCULAR | Status: DC | PRN
  Administered 2016-12-25: 1 mg via INTRAVENOUS

## 2016-12-25 SURGICAL SUPPLY — 26 items
CATH ROBINSON RED A/P 16FR (CATHETERS) ×3 IMPLANT
CLIP FILSHIE TUBAL LIGA STRL (Clip) ×3 IMPLANT
CLOTH BEACON ORANGE TIMEOUT ST (SAFETY) ×3 IMPLANT
DRSG OPSITE POSTOP 3X4 (GAUZE/BANDAGES/DRESSINGS) ×3 IMPLANT
DURAPREP 26ML APPLICATOR (WOUND CARE) ×3 IMPLANT
GLOVE BIO SURGEON STRL SZ 6.5 (GLOVE) ×4 IMPLANT
GLOVE BIO SURGEONS STRL SZ 6.5 (GLOVE) ×2
GLOVE BIOGEL PI IND STRL 7.0 (GLOVE) ×2 IMPLANT
GLOVE BIOGEL PI INDICATOR 7.0 (GLOVE) ×4
GOWN STRL REUS W/TWL LRG LVL3 (GOWN DISPOSABLE) ×6 IMPLANT
NDL SAFETY ECLIPSE 18X1.5 (NEEDLE) ×1 IMPLANT
NEEDLE HYPO 18GX1.5 SHARP (NEEDLE) ×2
NEEDLE INSUFFLATION 120MM (ENDOMECHANICALS) ×3 IMPLANT
NS IRRIG 1000ML POUR BTL (IV SOLUTION) ×3 IMPLANT
PACK LAPAROSCOPY BASIN (CUSTOM PROCEDURE TRAY) ×3 IMPLANT
PACK TRENDGUARD 450 HYBRID PRO (MISCELLANEOUS) ×1 IMPLANT
PACK TRENDGUARD 600 HYBRD PROC (MISCELLANEOUS) IMPLANT
PROTECTOR NERVE ULNAR (MISCELLANEOUS) ×6 IMPLANT
SLEEVE XCEL OPT CAN 5 100 (ENDOMECHANICALS) IMPLANT
SUT VICRYL 0 UR6 27IN ABS (SUTURE) ×3 IMPLANT
SUT VICRYL 4-0 PS2 18IN ABS (SUTURE) ×3 IMPLANT
TOWEL OR 17X24 6PK STRL BLUE (TOWEL DISPOSABLE) ×6 IMPLANT
TRENDGUARD 450 HYBRID PRO PACK (MISCELLANEOUS) ×3
TRENDGUARD 600 HYBRID PROC PK (MISCELLANEOUS)
TROCAR OPTI TIP 5M 100M (ENDOMECHANICALS) IMPLANT
TROCAR XCEL DIL TIP R 11M (ENDOMECHANICALS) ×3 IMPLANT

## 2016-12-25 NOTE — Anesthesia Postprocedure Evaluation (Signed)
Anesthesia Post Note  Patient: Andrea Diaz  Procedure(s) Performed: Procedure(s) (LRB): LAPAROSCOPIC TUBAL LIGATION (Bilateral)  Patient location during evaluation: PACU Anesthesia Type: General Level of consciousness: sedated and patient cooperative Pain management: pain level controlled Vital Signs Assessment: post-procedure vital signs reviewed and stable Respiratory status: spontaneous breathing Cardiovascular status: stable Anesthetic complications: no        Last Vitals:  Vitals:   12/25/16 1030 12/25/16 1045  BP: (!) 164/105 (!) 163/105  Pulse: 85 84  Resp: 11   Temp:      Last Pain:  Vitals:   12/25/16 1030  TempSrc:   PainSc: 4    Pain Goal: Patients Stated Pain Goal: 5 (12/25/16 0746)               Lewie Loron

## 2016-12-25 NOTE — OR Nursing (Signed)
Dilaudid 0.5 wasted in sink. -Paidyn Mcferran rn-tammy winfree rn witness

## 2016-12-25 NOTE — Op Note (Signed)
12/25/2016  10:19 AM  PATIENT:  Andrea Diaz  31 y.o. female  PRE-OPERATIVE DIAGNOSIS:  Undesired Fertility  POST-OPERATIVE DIAGNOSIS:  same  PROCEDURE:  Procedure(s): LAPAROSCOPIC TUBAL LIGATION (Bilateral), Application of Filsche clips  SURGEON:  Surgeon(s) and Role:    * Allie Bossier, MD - Primary  PHYSICIAN ASSISTANT:   ASSISTANTS: none   ANESTHESIA:   general  EBL:  Total I/O In: 1300 [I.V.:1300] Out: 5 [Blood:5]  BLOOD ADMINISTERED:none  DRAINS: none   LOCAL MEDICATIONS USED:  MARCAINE     SPECIMEN:  No Specimen  DISPOSITION OF SPECIMEN:  N/A  COUNTS:  YES  TOURNIQUET:  * No tourniquets in log *  DICTATION: .Dragon Dictation  PLAN OF CARE: Discharge to home after PACU  PATIENT DISPOSITION:  PACU - hemodynamically stable.   Delay start of Pharmacological VTE agent (>24hrs) due to surgical blood loss or risk of bleeding: not applicable   The risks, benefits, alternatives of surgery were explained understood, accepted. All questions were answered. She understands the failure rate of 1/400. She declines alternative forms of birth control. Her urine pregnancy test was negative. She was taken to the operating room and general anesthesia was applied without complication. She was placed in dorsal lithotomy position. Her abdomen and vagina were prepped and draped in the usual sterile fashion. A time out procedure was done. A bimanual exam revealed a normal, size and shape mobile uterus with nonenlarged adnexa. A Hulka manipulator was placed on the cervix. Gloves were changed and attention was turned to the abdomen. Approximately 5 mL of 0.5% Marcaine was used to infiltrate the subcutaneous tissue at the umbilicus. A vertical 1 cm incision was made. A Veres needle was placed in the pelvis. Low-flow CO2 was used to insufflate the abdomen to approximately 3 L. After good pneumoperitoneum was established, a 11 mm trocar was placed. Laparoscopy confirmed correct placement. She  was placed in the Trendelenburg position. Patient abdominal pressure was always less than 15. Her uterus ovaries and tubes appeared normal. There was no evidence of endometriosis. The oviducts were visually traced to their fimbriated end on each side. In the isthmic region of each oviduct, a Filshie clip was placed across the entire oviduct. There was no bleeding. The CO2 was allowed to escape from her abdomen. The umbilical fascia was closed with a figure of 0 Vicryl suture. No defects were palpable. The subcuticular closure was done with 4-0 Vicryl suture. The Hulka manipulator was removed. She was extubated and taken to recovery in stable condition. She tolerated the procedure well. The instrument, sponge, and needle counts were correct.

## 2016-12-25 NOTE — Anesthesia Procedure Notes (Signed)
Procedure Name: Intubation Date/Time: 12/25/2016 9:23 AM Performed by: Tobin Chad Pre-anesthesia Checklist: Patient identified, Emergency Drugs available, Suction available and Patient being monitored Patient Re-evaluated:Patient Re-evaluated prior to inductionOxygen Delivery Method: Circle system utilized and Simple face mask Preoxygenation: Pre-oxygenation with 100% oxygen Intubation Type: IV induction Ventilation: Mask ventilation without difficulty Laryngoscope Size: Mac and 3 Grade View: Grade II Tube type: Oral Tube size: 7.0 mm Number of attempts: 1 Airway Equipment and Method: Stylet Placement Confirmation: ETT inserted through vocal cords under direct vision,  positive ETCO2 and breath sounds checked- equal and bilateral Secured at: 23 cm Tube secured with: Tape Dental Injury: Injury to tongue

## 2016-12-25 NOTE — Discharge Instructions (Signed)
DISCHARGE INSTRUCTIONS: Laparoscopy  The following instructions have been prepared to help you care for yourself upon your return home today.  Wound care:  Do not get the incision wet for the first 24 hours. The incision should be kept clean and dry.  The Band-Aids or dressings may be removed the day after surgery.  Should the incision become sore, red, and swollen after the first week, check with your doctor.  Personal hygiene:  Shower the day after your procedure.  Activity and limitations:  Do NOT drive or operate any equipment today.  Do NOT lift anything more than 15 pounds for 2-3 weeks after surgery.  Do NOT rest in bed all day.  Walking is encouraged. Walk each day, starting slowly with 5-minute walks 3 or 4 times a day. Slowly increase the length of your walks.  Walk up and down stairs slowly.  Do NOT do strenuous activities, such as golfing, playing tennis, bowling, running, biking, weight lifting, gardening, mowing, or vacuuming for 2-4 weeks. Ask your doctor when it is okay to start.  Diet: Eat a light meal as desired this evening. You may resume your usual diet tomorrow.  Return to work: This is dependent on the type of work you do. For the most part you can return to a desk job within a week of surgery. If you are more active at work, please discuss this with your doctor.  What to expect after your surgery: You may have a slight burning sensation when you urinate on the first day. You may have a very small amount of blood in the urine. Expect to have a small amount of vaginal discharge/light bleeding for 1-2 weeks. It is not unusual to have abdominal soreness and bruising for up to 2 weeks. You may be tired and need more rest for about 1 week. You may experience shoulder pain for 24-72 hours. Lying flat in bed may relieve it.  Call your doctor for any of the following:  Develop a fever of 100.4 or greater  Inability to urinate 6 hours after discharge from  hospital  Severe pain not relieved by pain medications  Persistent of heavy bleeding at incision site  Redness or swelling around incision site after a week  Increasing nausea or vomiting  Patient Signature________________________________________ Nurse Signature_________________________________________Laparoscopic Tubal Ligation, Care After Refer to this sheet in the next few weeks. These instructions provide you with information about caring for yourself after your procedure. Your health care provider may also give you more specific instructions. Your treatment has been planned according to current medical practices, but problems sometimes occur. Call your health care provider if you have any problems or questions after your procedure. What can I expect after the procedure? After the procedure, it is common to have:  A sore throat.  Discomfort in your shoulder.  Mild discomfort or cramping in your abdomen.  Gas pains.  Pain or soreness in the area where the surgical cut (incision) was made.  A bloated feeling.  Tiredness.  Nausea.  Vomiting. Follow these instructions at home: Medicines   Take over-the-counter and prescription medicines only as told by your health care provider.  Do not take aspirin because it can cause bleeding.  Do not drive or operate heavy machinery while taking prescription pain medicine. Activity   Rest for the rest of the day.  Return to your normal activities as told by your health care provider. Ask your health care provider what activities are safe for you. Incision care  Follow instructions from your health care provider about how to take care of your incision. Make sure you:  Wash your hands with soap and water before you change your bandage (dressing). If soap and water are not available, use hand sanitizer.  Change your dressing as told by your health care provider.  Leave stitches (sutures) in place. They may need to stay in  place for 2 weeks or longer.  Check your incision area every day for signs of infection. Check for:  More redness, swelling, or pain.  More fluid or blood.  Warmth.  Pus or a bad smell. Other Instructions   Do not take baths, swim, or use a hot tub until your health care provider approves. You may take showers.  Keep all follow-up visits as told by your health care provider. This is important.  Have someone help you with your daily household tasks for the first few days. Contact a health care provider if:  You have more redness, swelling, or pain around your incision.  Your incision feels warm to the touch.  You have pus or a bad smell coming from your incision.  The edges of your incision break open after the sutures have been removed.  Your pain does not improve after 2-3 days.  You have a rash.  You repeatedly become dizzy or light-headed.  Your pain medicine is not helping.  You are constipated. Get help right away if:  You have a fever.  You faint.  You have increasing pain in your abdomen.  You have severe pain in one or both of your shoulders.  You have fluid or blood coming from your sutures or from your vagina.  You have shortness of breath or difficulty breathing.  You have chest pain or leg pain.  You have ongoing nausea, vomiting, or diarrhea. This information is not intended to replace advice given to you by your health care provider. Make sure you discuss any questions you have with your health care provider. Document Released: 03/30/2005 Document Revised: 02/13/2016 Document Reviewed: 08/21/2015 Elsevier Interactive Patient Education  2017 [employer]   DO NOT HAVE UNPROTECTED INTERCOURSE UNTIL AFTER NEXT MENSTRUAL PERIOD, OR 4 WEEKS, WHICHEVER IS SOONER

## 2016-12-25 NOTE — Transfer of Care (Signed)
Immediate Anesthesia Transfer of Care Note  Patient: Andrea Diaz  Procedure(s) Performed: Procedure(s): LAPAROSCOPIC TUBAL LIGATION (Bilateral)  Patient Location: PACU  Anesthesia Type:General  Level of Consciousness: awake, oriented, sedated and patient cooperative  Airway & Oxygen Therapy: Patient Spontanous Breathing and Patient connected to nasal cannula oxygen  Post-op Assessment: Report given to RN and Post -op Vital signs reviewed and stable  Post vital signs: Reviewed and stable  Last Vitals:  Vitals:   12/25/16 0746  BP: (!) 129/95  Pulse: 78  Resp: 16  Temp: 36.5 C    Last Pain:  Vitals:   12/25/16 0746  TempSrc: Oral      Patients Stated Pain Goal: 5 (12/25/16 0746)  Complications: No apparent anesthesia complications

## 2016-12-25 NOTE — Anesthesia Preprocedure Evaluation (Signed)
Anesthesia Evaluation  Patient identified by MRN, date of birth, ID band Patient awake    Reviewed: Allergy & Precautions, NPO status , Patient's Chart, lab work & pertinent test results  Airway Mallampati: III       Dental   Pulmonary asthma , former smoker,    Pulmonary exam normal        Cardiovascular negative cardio ROS Normal cardiovascular exam     Neuro/Psych negative neurological ROS     GI/Hepatic negative GI ROS, Neg liver ROS,   Endo/Other  diabetesMorbid obesity  Renal/GU negative Renal ROS     Musculoskeletal   Abdominal   Peds  Hematology negative hematology ROS (+)   Anesthesia Other Findings   Reproductive/Obstetrics (+) Pregnancy                             Lab Results  Component Value Date   WBC 7.0 12/20/2016   HGB 12.0 12/20/2016   HCT 37.1 12/20/2016   MCV 80.0 12/20/2016   PLT 263 12/20/2016    Anesthesia Physical  Anesthesia Plan  ASA: III  Anesthesia Plan: General   Post-op Pain Management:    Induction: Intravenous and Rapid sequence  Airway Management Planned: Oral ETT  Additional Equipment:   Intra-op Plan:   Post-operative Plan: Extubation in OR  Informed Consent: I have reviewed the patients History and Physical, chart, labs and discussed the procedure including the risks, benefits and alternatives for the proposed anesthesia with the patient or authorized representative who has indicated his/her understanding and acceptance.   Dental advisory given  Plan Discussed with: CRNA  Anesthesia Plan Comments:         Anesthesia Quick Evaluation

## 2016-12-26 ENCOUNTER — Encounter (HOSPITAL_COMMUNITY): Payer: Self-pay | Admitting: Obstetrics & Gynecology

## 2016-12-26 NOTE — H&P (Signed)
Andrea Diaz is an 31 y.o. female P1 here for a BTL. She is 100% sure that she does not want more kids. She is aware that this procedure is considered permanent.  No LMP recorded.    Past Medical History:  Diagnosis Date  . Asthma   . Bilateral carpal tunnel syndrome    with pregancy  . Constipation   . Diabetes mellitus without complication (HCC)    insulin  . Gestational diabetes   . Pre-diabetes     Past Surgical History:  Procedure Laterality Date  . LAPAROSCOPIC TUBAL LIGATION Bilateral 12/25/2016   Procedure: LAPAROSCOPIC TUBAL LIGATION;  Surgeon: Allie Bossier, MD;  Location: [facility];  Service: Gynecology;  Laterality: Bilateral;  . NO PAST SURGERIES      Family History  Problem Relation Age of Onset  . Hypertension Mother   . Head & neck cancer Father   . Breast cancer Maternal Aunt   . Breast cancer Paternal Aunt     Social History:  reports that she has quit smoking. Her smoking use included Cigarettes. She has quit using smokeless tobacco. She reports that she drinks alcohol. She reports that she does not use drugs.  Allergies: No Known Allergies  No prescriptions prior to admission.    ROS  Blood pressure (!) 163/101, pulse 68, temperature 98.2 F (36.8 C), temperature source Oral, resp. rate 11, SpO2 99 %, unknown if currently breastfeeding. Physical Exam  WNWHBFNAD Breathing, conversing, and ambulating normally Heart- rrr Lungs- CTAB Abd- benign  Results for orders placed or performed during the hospital encounter of 12/25/16 (from the past 24 hour(s))  Glucose, capillary     Status: Abnormal   Collection Time: 12/25/16 10:36 AM  Result Value Ref Range   Glucose-Capillary 111 (H) 65 - 99 mg/dL   Comment 1 Document in Chart     No results found.  Assessment/Plan: Desire for PERMANENT sterility. She has declined LARCs.  She understands the risks of surgery, including, but not to infection, bleeding, DVTs, damage to bowel, bladder, ureters. She wishes  to proceed.     Allie Bossier 12/26/2016, 8:59 AM

## 2016-12-31 ENCOUNTER — Ambulatory Visit: Payer: Medicaid Other | Admitting: Advanced Practice Midwife

## 2016-12-31 ENCOUNTER — Encounter: Payer: Self-pay | Admitting: Advanced Practice Midwife

## 2016-12-31 ENCOUNTER — Ambulatory Visit (INDEPENDENT_AMBULATORY_CARE_PROVIDER_SITE_OTHER): Payer: Medicaid Other | Admitting: Advanced Practice Midwife

## 2016-12-31 VITALS — BP 133/89 | HR 98 | Wt 250.7 lb

## 2016-12-31 DIAGNOSIS — S6991XS Unspecified injury of right wrist, hand and finger(s), sequela: Secondary | ICD-10-CM

## 2016-12-31 DIAGNOSIS — O24119 Pre-existing diabetes mellitus, type 2, in pregnancy, unspecified trimester: Secondary | ICD-10-CM

## 2016-12-31 DIAGNOSIS — Z8249 Family history of ischemic heart disease and other diseases of the circulatory system: Secondary | ICD-10-CM

## 2016-12-31 MED ORDER — WRIST/THUMB SPLINT/RIGHT MED MISC: 1.0000 | Freq: Every day | 0 refills | Status: AC

## 2016-12-31 NOTE — Progress Notes (Signed)
Subjective:     Andrea Diaz is a 31 y.o. female who presents for a postpartum visit. She is 5 weeks postpartum following a spontaneous vaginal delivery. I have fully reviewed the prenatal and intrapartum course. The delivery was at 38 gestational weeks. Outcome: spontaneous vaginal delivery. Anesthesia: epidural. Postpartum course has been unremarkable. Baby's course has been unremarkable. Baby is feeding by breast and supplemented with formula. Bleeding no bleeding. Bowel function is abnormal: constipation. Bladder function is normal. Patient is not sexually active. Contraception method is tubal ligation. Postpartum depression screening: negative. Today, patient also complains of a sore/weak right wrist which she sustained a FOOSH injury to late in her third trimester.  She did not seek medical care for the injury.  The pain is most noticeable when lifting things with her right hand, it does not radiate beyond a few centimeters proximally from her right wrist.  The following portions of the patient's history were reviewed and updated as appropriate: allergies, current medications, past family history, past medical history, past social history, past surgical history and problem list.  Review of Systems Constitutional: negative Respiratory: negative Cardiovascular: negative Gastrointestinal: negative except for Well healed laproscopic incisions without erythema or signs of infection. Genitourinary:negative except for Well approximated minute incision at the posterior midline of the vaginal vault.  No erythema, discharge, or signs of infection. and no signs of other trauma. Musculoskeletal:positive for right wrist pain, negative for back, neck or other joint pain.     Objective:    BP 133/89   Pulse 98   Wt 250 lb 11.2 oz (113.7 kg)   LMP 12/25/2016   Breastfeeding? Yes   BMI 39.27 kg/m    VS reviewed, nursing note reviewed,  Constitutional: well developed, well nourished, no distress HEENT:  normocephalic CV: normal rate HEART: normal rate, heart sounds, regular rhythm RESP: normal effort, lung sounds clear and equal bilaterally Abdomen: soft Neuro: alert and oriented x 3 Skin: warm, dry Psych: affect normal MSK: Positive Finkelstein Test right wrist, negative Tinel's Sign. All other distal and proximal joints display full ROM without pain or weakness bilaterally.   On inspection, 1st degree vaginal lac well approximated, no visual sutures, no pain with light palpation  Assessment:   1. Wrist injury, right, sequela --Pt to try splint and follow up with primary care as planned. - Elastic Bandages & Supports (WRIST/THUMB SPLINT/RIGHT MED) MISC; 1 Device by Does not apply route daily.  Dispense: 1 each; Refill: 0  2. Pre-existing type 2 diabetes mellitus during pregnancy, antepartum --Pt taking Metformin daily, same as prepregnancy.  Has plan to see primary care.   3. Postpartum examination following vaginal delivery --First degree tear healing well, no complications.  4. Family history of essential hypertension --Pt worried about HTN, strong family history of maternal and paternal side.  BP at home have been 130s/90s, normotensive but high normal today.  No treatment required. Discussed lifestyle changes with pt. --Pt to follow up with primary care.  Plan:    1. Contraception: tubal ligation  2. Prescription issued for Right Thumb Spica splint.       3. Follow up in: 1 year with primary care or as needed.

## 2016-12-31 NOTE — Patient Instructions (Signed)
Recommend primary care visit for high blood pressure and to evaluate wrist pain. [facility] Same-day appointments available. Call [phone] to schedule.  DASH Eating Plan DASH stands for "Dietary Approaches to Stop Hypertension." The DASH eating plan is a healthy eating plan that has been shown to reduce high blood pressure (hypertension). It may also reduce your risk for type 2 diabetes, heart disease, and stroke. The DASH eating plan may also help with weight loss. What are tips for following this plan? General guidelines   Avoid eating more than 2,300 mg (milligrams) of salt (sodium) a day. If you have hypertension, you may need to reduce your sodium intake to 1,500 mg a day.  Limit alcohol intake to no more than 1 drink a day for nonpregnant women and 2 drinks a day for men. One drink equals 12 oz of beer, 5 oz of wine, or 1 oz of hard liquor.  Work with your health care provider to maintain a healthy body weight or to lose weight. Ask what an ideal weight is for you.  Get at least 30 minutes of exercise that causes your heart to beat faster (aerobic exercise) most days of the week. Activities may include walking, swimming, or biking.  Work with your health care provider or diet and nutrition specialist (dietitian) to adjust your eating plan to your individual calorie needs. Reading food labels   Check food labels for the amount of sodium per serving. Choose foods with less than 5 percent of the Daily Value of sodium. Generally, foods with less than 300 mg of sodium per serving fit into this eating plan.  To find whole grains, look for the word "whole" as the first word in the ingredient list. Shopping   Buy products labeled as "low-sodium" or "no salt added."  Buy fresh foods. Avoid canned foods and premade or frozen meals. Cooking   Avoid adding salt when cooking. Use salt-free seasonings or herbs instead of table salt or sea salt. Check with your  health care provider or pharmacist before using salt substitutes.  Do not fry foods. Cook foods using healthy methods such as baking, boiling, grilling, and broiling instead.  Cook with heart-healthy oils, such as olive, canola, soybean, or sunflower oil. Meal planning    Eat a balanced diet that includes:  5 or more servings of fruits and vegetables each day. At each meal, try to fill half of your plate with fruits and vegetables.  Up to 6-8 servings of whole grains each day.  Less than 6 oz of lean meat, poultry, or fish each day. A 3-oz serving of meat is about the same size as a deck of cards. One egg equals 1 oz.  2 servings of low-fat dairy each day.  A serving of nuts, seeds, or beans 5 times each week.  Heart-healthy fats. Healthy fats called Omega-3 fatty acids are found in foods such as flaxseeds and coldwater fish, like sardines, salmon, and mackerel.  Limit how much you eat of the following:  Canned or prepackaged foods.  Food that is high in trans fat, such as fried foods.  Food that is high in saturated fat, such as fatty meat.  Sweets, desserts, sugary drinks, and other foods with added sugar.  Full-fat dairy products.  Do not salt foods before eating.  Try to eat at least 2 vegetarian meals each week.  Eat more home-cooked food and less restaurant, buffet, and fast food.  When eating at a restaurant, ask that  your food be prepared with less salt or no salt, if possible. What foods are recommended? The items listed may not be a complete list. Talk with your dietitian about what dietary choices are best for you. Grains  Whole-grain or whole-wheat bread. Whole-grain or whole-wheat pasta. Brown rice. Orpah Cobb. Bulgur. Whole-grain and low-sodium cereals. Pita bread. Low-fat, low-sodium crackers. Whole-wheat flour tortillas. Vegetables  Fresh or frozen vegetables (raw, steamed, roasted, or grilled). Low-sodium or reduced-sodium tomato and vegetable  juice. Low-sodium or reduced-sodium tomato sauce and tomato paste. Low-sodium or reduced-sodium canned vegetables. Fruits  All fresh, dried, or frozen fruit. Canned fruit in natural juice (without added sugar). Meat and other protein foods  Skinless chicken or [place]. Ground chicken or [place]. Pork with fat trimmed off. Fish and seafood. Egg whites. Dried beans, peas, or lentils. Unsalted nuts, nut butters, and seeds. Unsalted canned beans. Lean cuts of beef with fat trimmed off. Low-sodium, lean deli meat. Dairy  Low-fat (1%) or fat-free (skim) milk. Fat-free, low-fat, or reduced-fat cheeses. Nonfat, low-sodium ricotta or cottage cheese. Low-fat or nonfat yogurt. Low-fat, low-sodium cheese. Fats and oils  Soft margarine without trans fats. Vegetable oil. Low-fat, reduced-fat, or light mayonnaise and salad dressings (reduced-sodium). Canola, safflower, olive, soybean, and sunflower oils. Avocado. Seasoning and other foods  Herbs. Spices. Seasoning mixes without salt. Unsalted popcorn and pretzels. Fat-free sweets. What foods are not recommended? The items listed may not be a complete list. Talk with your dietitian about what dietary choices are best for you. Grains  Baked goods made with fat, such as croissants, muffins, or some breads. Dry pasta or rice meal packs. Vegetables  Creamed or fried vegetables. Vegetables in a cheese sauce. Regular canned vegetables (not low-sodium or reduced-sodium). Regular canned tomato sauce and paste (not low-sodium or reduced-sodium). Regular tomato and vegetable juice (not low-sodium or reduced-sodium). Rosita Fire. Olives. Fruits  Canned fruit in a light or heavy syrup. Fried fruit. Fruit in cream or butter sauce. Meat and other protein foods  Fatty cuts of meat. Ribs. Fried meat. Tomasa Blase. Sausage. Bologna and other processed lunch meats. Salami. Fatback. Hotdogs. Bratwurst. Salted nuts and seeds. Canned beans with added salt. Canned or smoked fish. Whole eggs or  egg yolks. Chicken or [place] with skin. Dairy  Whole or 2% milk, cream, and half-and-half. Whole or full-fat cream cheese. Whole-fat or sweetened yogurt. Full-fat cheese. Nondairy creamers. Whipped toppings. Processed cheese and cheese spreads. Fats and oils  Butter. Stick margarine. Lard. Shortening. Ghee. Bacon fat. Tropical oils, such as coconut, palm kernel, or palm oil. Seasoning and other foods  Salted popcorn and pretzels. Onion salt, garlic salt, seasoned salt, table salt, and sea salt. Worcestershire sauce. Tartar sauce. Barbecue sauce. Teriyaki sauce. Soy sauce, including reduced-sodium. Steak sauce. Canned and packaged gravies. Fish sauce. Oyster sauce. Cocktail sauce. Horseradish that you find on the shelf. Ketchup. Mustard. Meat flavorings and tenderizers. Bouillon cubes. Hot sauce and Tabasco sauce. Premade or packaged marinades. Premade or packaged taco seasonings. Relishes. Regular salad dressings. Where to find more information:  National Heart, Lung, and Blood Institute: PopSteam.is  American Heart Association: www.heart.org Summary  The DASH eating plan is a healthy eating plan that has been shown to reduce high blood pressure (hypertension). It may also reduce your risk for type 2 diabetes, heart disease, and stroke.  With the DASH eating plan, you should limit salt (sodium) intake to 2,300 mg a day. If you have hypertension, you may need to reduce your sodium intake to 1,500 mg a day.  When on the DASH eating plan, aim to eat more fresh fruits and vegetables, whole grains, lean proteins, low-fat dairy, and heart-healthy fats.  Work with your health care provider or diet and nutrition specialist (dietitian) to adjust your eating plan to your individual calorie needs. This information is not intended to replace advice given to you by your health care provider. Make sure you discuss any questions you have with your health care provider. Document Released: 08/30/2011  Document Revised: 09/03/2016 Document Reviewed: 09/03/2016 Elsevier Interactive Patient Education  2017 [employer].

## 2017-01-01 ENCOUNTER — Telehealth: Payer: Self-pay | Admitting: General Practice

## 2017-01-16 ENCOUNTER — Ambulatory Visit: Payer: Medicaid Other | Admitting: Advanced Practice Midwife

## 2017-01-16 ENCOUNTER — Other Ambulatory Visit: Payer: Self-pay | Admitting: Obstetrics & Gynecology

## 2017-01-16 ENCOUNTER — Telehealth: Payer: Self-pay | Admitting: *Deleted

## 2017-01-16 VITALS — BP 137/87 | HR 91

## 2017-01-16 DIAGNOSIS — IMO0001 Reserved for inherently not codable concepts without codable children: Secondary | ICD-10-CM

## 2017-01-16 DIAGNOSIS — T814XXA Infection following a procedure, initial encounter: Principal | ICD-10-CM

## 2017-01-16 MED ORDER — BACITRACIN ZINC 500 UNIT/GM EX OINT: TOPICAL_OINTMENT | Freq: Four times a day (QID) | CUTANEOUS | Status: AC

## 2017-01-16 NOTE — Progress Notes (Signed)
S: 30 y.o. G1P1001 s/p NSVD followed by BTL on 12/25/16 with drainage from her umbilical incision. She reports continues light blood tinged drainage with odor.  She denies pain or fever  O: BP 137/87   Pulse 91   LMP 12/25/2016   VS reviewed, nursing note reviewed,  Constitutional: well developed, well nourished, no distress HEENT: normocephalic CV: normal rate Pulm/chest wall: normal effort Abdomen: soft Neuro: alert and oriented x 3 Skin: warm, dry Psych: affect normal  Umbilical incision with brown drainage dried in ring around incision site, with mild odor.  Incision well approximated with no erythema or edema  A: 1. Incisional infection, initial encounter --Consult Dr Erin Fulling to review assessment and findings --Bacitracin QID -F/U in 2 weeks as scheduled  P: D/C home with infection precautions   Sharen Counter, CNM 10:09 AM

## 2017-01-16 NOTE — Progress Notes (Signed)
Pt reports having some drainage from BTS incision which dries and becomes crusty, then reoccurs again later.  She denies pain or fever. Per exam, reddish dried discharge observed @ umbilicus. No active bleeding or discharge from umbilicus/incision area.  Sharen Counter, CNM was asked to see pt for consultation.

## 2017-01-16 NOTE — Telephone Encounter (Signed)
Pt left message stating that her pharmacy did not receive a prescription for bacitracin ointment. I returned pt's call and advised her that she will not require a rx - it is an OTC medication.  Pt voiced understanding.

## 2017-01-23 ENCOUNTER — Telehealth: Payer: Self-pay

## 2017-01-23 NOTE — Telephone Encounter (Signed)
Called patient concerning medication refills. Patient stated she has already taken care of the issue.

## 2017-01-23 NOTE — Telephone Encounter (Signed)
-----   Message from Myra C Dove, MD sent at 01/23/2017  1:47 PM EDT ----- She will need her primary care MD to refill this medication. Thanks 

## 2017-01-23 NOTE — Telephone Encounter (Signed)
Called patient today about her prescription refill. Patient stated that she had  already spoke to someone and purchased over the counter medication and no longer needed a refill.

## 2017-01-23 NOTE — Telephone Encounter (Signed)
-----   Message from Allie Bossier, MD sent at 01/23/2017  1:47 PM EDT ----- She will need her primary care MD to refill this medication. Thanks

## 2017-02-13 ENCOUNTER — Ambulatory Visit: Payer: Medicaid Other | Admitting: Obstetrics & Gynecology

## 2017-02-22 ENCOUNTER — Other Ambulatory Visit (HOSPITAL_COMMUNITY): Payer: Self-pay | Admitting: Obstetrics & Gynecology

## 2017-03-15 ENCOUNTER — Other Ambulatory Visit (HOSPITAL_COMMUNITY): Payer: Self-pay | Admitting: Obstetrics & Gynecology
